# Patient Record
Sex: Male | Born: 1985 | Race: White | Hispanic: No | Marital: Married | Smoking: Never smoker
Health system: Southern US, Community
[De-identification: ages and names within clinical notes are randomized; demographics above are authoritative.]

## PROBLEM LIST (undated history)

## (undated) DIAGNOSIS — T7840XA Allergy, unspecified, initial encounter: Secondary | ICD-10-CM

## (undated) DIAGNOSIS — E079 Disorder of thyroid, unspecified: Secondary | ICD-10-CM

## (undated) HISTORY — DX: Allergy, unspecified, initial encounter: T78.40XA

## (undated) HISTORY — DX: Disorder of thyroid, unspecified: E07.9

---

## 2012-07-29 ENCOUNTER — Emergency Department: Payer: Self-pay | Admitting: Unknown Physician Specialty

## 2013-02-08 ENCOUNTER — Ambulatory Visit: Payer: Self-pay | Admitting: General Practice

## 2015-06-29 ENCOUNTER — Encounter: Payer: Self-pay | Admitting: Physician Assistant

## 2015-06-29 ENCOUNTER — Telehealth: Payer: Self-pay | Admitting: Physician Assistant

## 2015-06-29 DIAGNOSIS — J4520 Mild intermittent asthma, uncomplicated: Secondary | ICD-10-CM

## 2015-06-29 MED ORDER — BUDESONIDE-FORMOTEROL FUMARATE 80-4.5 MCG/ACT IN AERO: 2.0000 | INHALATION_SPRAY | Freq: Two times a day (BID) | RESPIRATORY_TRACT | Status: DC

## 2015-06-29 NOTE — Telephone Encounter (Signed)
Med refill request, sent rx to [facility], [place]

## 2015-08-01 ENCOUNTER — Ambulatory Visit: Payer: Self-pay | Admitting: Physician Assistant

## 2015-08-01 ENCOUNTER — Encounter: Payer: Self-pay | Admitting: Physician Assistant

## 2015-08-01 VITALS — BP 119/80 | HR 66 | Temp 97.9°F

## 2015-08-01 DIAGNOSIS — J069 Acute upper respiratory infection, unspecified: Secondary | ICD-10-CM

## 2015-08-01 MED ORDER — AMOXICILLIN-POT CLAVULANATE 875-125 MG PO TABS: 1.0000 | ORAL_TABLET | Freq: Two times a day (BID) | ORAL | Status: DC

## 2015-08-01 NOTE — Progress Notes (Signed)
S: C/o runny nose and congestion for 3 days, hoarse voice and sore throat, no fever, chills, cp/sob, v/d; mucus is green and thick, cough is sporadic, c/o of facial and dental pain.   Using otc meds:   O: PE: perrl eomi, normocephalic, tms dull, nasal mucosa red and swollen, throat injected, neck supple no lymph, lungs c t a, cv rrr, neuro intact  A:  Acute sinusitis   P: augmentin 875mg  bid x 10d, drink fluids, continue regular meds , use otc meds of choice, return if not improving in 5 days, return earlier if worsening , pt to return for fasting labs

## 2015-08-02 ENCOUNTER — Other Ambulatory Visit: Payer: Self-pay

## 2015-08-02 DIAGNOSIS — Z299 Encounter for prophylactic measures, unspecified: Secondary | ICD-10-CM

## 2015-08-02 NOTE — Progress Notes (Signed)
Patient came in to have blood drawn per Susan's order.  Blood was drawn from left arm without any incident.

## 2015-08-03 LAB — CMP12+LP+TP+TSH+6AC+CBC/D/PLT
ALT: 68 IU/L — AB (ref 0–44)
AST: 30 IU/L (ref 0–40)
Albumin/Globulin Ratio: 1.6 (ref 1.1–2.5)
Albumin: 4.3 g/dL (ref 3.5–5.5)
Alkaline Phosphatase: 77 IU/L (ref 39–117)
BASOS ABS: 0.1 10*3/uL (ref 0.0–0.2)
BUN / CREAT RATIO: 16 (ref 8–19)
BUN: 15 mg/dL (ref 6–20)
Basos: 1 %
Bilirubin Total: 0.4 mg/dL (ref 0.0–1.2)
CALCIUM: 9.4 mg/dL (ref 8.7–10.2)
CHLORIDE: 100 mmol/L (ref 97–106)
CHOL/HDL RATIO: 4.9 ratio (ref 0.0–5.0)
CREATININE: 0.95 mg/dL (ref 0.76–1.27)
Cholesterol, Total: 209 mg/dL — ABNORMAL HIGH (ref 100–199)
EOS (ABSOLUTE): 0.3 10*3/uL (ref 0.0–0.4)
EOS: 4 %
ESTIMATED CHD RISK: 1 times avg. (ref 0.0–1.0)
Free Thyroxine Index: 3.4 (ref 1.2–4.9)
GFR, EST AFRICAN AMERICAN: 123 mL/min/{1.73_m2} (ref >59)
GFR, EST NON AFRICAN AMERICAN: 106 mL/min/{1.73_m2} (ref >59)
GGT: 126 IU/L — AB (ref 0–65)
GLUCOSE: 87 mg/dL (ref 65–99)
Globulin, Total: 2.7 g/dL (ref 1.5–4.5)
HDL: 43 mg/dL (ref >39)
Hematocrit: 46.4 % (ref 37.5–51.0)
Hemoglobin: 15.5 g/dL (ref 12.6–17.7)
IRON: 114 ug/dL (ref 38–169)
Immature Grans (Abs): 0 10*3/uL (ref 0.0–0.1)
Immature Granulocytes: 0 %
LDH: 158 IU/L (ref 121–224)
LDL Calculated: 141 mg/dL — ABNORMAL HIGH (ref 0–99)
LYMPHS ABS: 2.9 10*3/uL (ref 0.7–3.1)
Lymphs: 41 %
MCH: 30.9 pg (ref 26.6–33.0)
MCHC: 33.4 g/dL (ref 31.5–35.7)
MCV: 92 fL (ref 79–97)
MONOCYTES: 8 %
Monocytes Absolute: 0.5 10*3/uL (ref 0.1–0.9)
NEUTROS ABS: 3.2 10*3/uL (ref 1.4–7.0)
Neutrophils: 46 %
PHOSPHORUS: 3.9 mg/dL (ref 2.5–4.5)
PLATELETS: 300 10*3/uL (ref 150–379)
POTASSIUM: 5 mmol/L (ref 3.5–5.2)
RBC: 5.02 x10E6/uL (ref 4.14–5.80)
RDW: 13 % (ref 12.3–15.4)
Sodium: 141 mmol/L (ref 136–144)
T3 UPTAKE RATIO: 30 % (ref 24–39)
T4 TOTAL: 11.4 ug/dL (ref 4.5–12.0)
TOTAL PROTEIN: 7 g/dL (ref 6.0–8.5)
TSH: 3.73 u[IU]/mL (ref 0.450–4.500)
Triglycerides: 127 mg/dL (ref 0–149)
URIC ACID: 6 mg/dL (ref 3.7–8.6)
VLDL Cholesterol Cal: 25 mg/dL (ref 5–40)
WBC: 7 10*3/uL (ref 3.4–10.8)

## 2015-08-03 LAB — VITAMIN B12: VITAMIN B 12: 678 pg/mL (ref 211–946)

## 2015-08-03 LAB — VITAMIN D 25 HYDROXY (VIT D DEFICIENCY, FRACTURES): VIT D 25 HYDROXY: 21 ng/mL — AB (ref 30.0–100.0)

## 2015-08-07 MED ORDER — VITAMIN D (ERGOCALCIFEROL) 1.25 MG (50000 UNIT) PO CAPS: 50000.0000 [IU] | ORAL_CAPSULE | ORAL | Status: DC

## 2015-08-07 NOTE — Addendum Note (Signed)
Addended by: Faythe GheeFISHER, Deshun Sedivy W on: 08/07/2015 03:54 PM   Modules accepted: Orders

## 2015-09-19 ENCOUNTER — Encounter: Payer: Self-pay | Admitting: Physician Assistant

## 2015-09-19 ENCOUNTER — Ambulatory Visit: Payer: Self-pay | Admitting: Physician Assistant

## 2015-09-19 VITALS — BP 120/80 | HR 62 | Temp 97.6°F

## 2015-09-19 DIAGNOSIS — J018 Other acute sinusitis: Secondary | ICD-10-CM

## 2015-09-19 DIAGNOSIS — R748 Abnormal levels of other serum enzymes: Secondary | ICD-10-CM

## 2015-09-19 MED ORDER — AMOXICILLIN-POT CLAVULANATE 875-125 MG PO TABS: 1.0000 | ORAL_TABLET | Freq: Two times a day (BID) | ORAL | Status: DC

## 2015-09-19 NOTE — Progress Notes (Signed)
Contacted [facility] spoke with Alvino ChapelEllen cancel order. Contacted [facility]([place]) per Darl PikesSusan for Augmentin 875mg   1 po bid no refill.  [place]

## 2015-09-19 NOTE — Addendum Note (Signed)
Addended by: Catha BrowEACON, MONIQUE T on: 09/19/2015 03:07 PM   Modules accepted: Orders

## 2015-09-19 NOTE — Progress Notes (Signed)
S: C/o runny nose and congestion for 3 days, no fever, chills, cp/sob, v/d; mucus is green and thick, cough is sporadic, c/o of facial and dental pain. 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

## 2015-09-20 LAB — HEPATIC FUNCTION PANEL
ALT: 63 IU/L — ABNORMAL HIGH (ref 0–44)
AST: 34 IU/L (ref 0–40)
Albumin: 4.4 g/dL (ref 3.5–5.5)
Alkaline Phosphatase: 48 IU/L (ref 39–117)
BILIRUBIN, DIRECT: 0.16 mg/dL (ref 0.00–0.40)
Bilirubin Total: 0.6 mg/dL (ref 0.0–1.2)
TOTAL PROTEIN: 7.2 g/dL (ref 6.0–8.5)

## 2015-09-21 NOTE — Progress Notes (Signed)
I spoke with the patient about his lab results and he expressed understanding.  Patient will return in 6 months to recheck his liver enzyme levels.

## 2015-10-16 ENCOUNTER — Encounter: Payer: Self-pay | Admitting: Physician Assistant

## 2015-10-16 ENCOUNTER — Ambulatory Visit: Payer: Self-pay | Admitting: Physician Assistant

## 2015-10-16 VITALS — BP 120/80 | HR 95 | Temp 98.0°F

## 2015-10-16 DIAGNOSIS — J018 Other acute sinusitis: Secondary | ICD-10-CM

## 2015-10-16 MED ORDER — CEFDINIR 300 MG PO CAPS: 300.0000 mg | ORAL_CAPSULE | Freq: Two times a day (BID) | ORAL | Status: DC

## 2015-10-16 NOTE — Progress Notes (Signed)
S: C/o runny nose and congestion for 3 days, brown mucus, recent similar problem got better with augmentin but sx returned;  no fever, chills, cp/sob, v/d; c/o of facial and dental pain, increased pressure behind r eye  Using otc meds:   O: PE: vitals wnl, nad, perrl eomi, normocephalic, tms dull, nasal mucosa red and swollen, throat injected, neck supple no lymph, lungs c t a, cv rrr, neuro intact  A:  Acute sinusitis   P: omnicef, saline nasal rinse bid; claritin; drink fluids, continue regular meds , use otc meds of choice, return if not improving in 5 days, return earlier if worsening

## 2015-10-31 NOTE — Progress Notes (Signed)
Patient ID: Howard Decker, male   DOB: 1986/05/21, 30 y.o.   MRN: [id number] Patient has been scheduled to see Dr. Jenne Campus at [facility] on 11/01/2015 @ 9:45am.  Patient is aware of appointment.

## 2015-11-01 ENCOUNTER — Encounter: Payer: Self-pay | Admitting: Physician Assistant

## 2015-11-01 ENCOUNTER — Ambulatory Visit: Payer: Self-pay | Admitting: Physician Assistant

## 2015-11-01 VITALS — BP 110/70 | HR 66 | Temp 97.6°F

## 2015-11-01 DIAGNOSIS — K122 Cellulitis and abscess of mouth: Secondary | ICD-10-CM

## 2015-11-01 MED ORDER — METHYLPREDNISOLONE 4 MG PO TBPK: ORAL_TABLET | ORAL | Status: DC

## 2015-11-01 NOTE — Progress Notes (Signed)
S: c/o sore on uvula, no fever/chills, saw some blood in his saliva, no cough or congestion  O: vitals wnl, nad, tms clear, nasal mucosa wnl, throat with elongated uvula, area is red with ?ulcer, neck supple no lymph lungs c t a, cv rrr  A: uvulitis  P: medrol dose pack, f/u with ENT for scheduled appt on Friday

## 2015-11-13 DIAGNOSIS — J324 Chronic pansinusitis: Secondary | ICD-10-CM | POA: Insufficient documentation

## 2015-12-07 ENCOUNTER — Ambulatory Visit: Payer: Self-pay | Admitting: Physician Assistant

## 2015-12-07 ENCOUNTER — Encounter: Payer: Self-pay | Admitting: Physician Assistant

## 2015-12-07 VITALS — BP 130/70 | HR 78 | Temp 98.2°F

## 2015-12-07 DIAGNOSIS — J069 Acute upper respiratory infection, unspecified: Secondary | ICD-10-CM

## 2015-12-07 DIAGNOSIS — R509 Fever, unspecified: Secondary | ICD-10-CM

## 2015-12-07 LAB — POCT INFLUENZA A/B
INFLUENZA B, POC: NEGATIVE
Influenza A, POC: NEGATIVE

## 2015-12-07 MED ORDER — AZITHROMYCIN 250 MG PO TABS: ORAL_TABLET | ORAL | Status: AC

## 2015-12-07 NOTE — Progress Notes (Signed)
S: C/o runny nose and congestion with dry cough for 2 days, + fever, chills, has more chest congestion than head congestion, denies cp/sob, v/d; mucus was green/brown this am ,   Using otc meds: allegra, mucinex  O: PE: vitals wnl, nad,  perrl eomi, normocephalic, tms dull, nasal mucosa red and swollen, throat injected, neck supple no lymph, lungs c t a, cv rrr, neuro intact, flu swab neg  A:  Acute bronchitis  P: zpack, mucinex, drink fluids, continue regular meds , use otc meds of choice, return if not improving in 5 days, return earlier if worsening

## 2015-12-08 ENCOUNTER — Other Ambulatory Visit: Payer: Self-pay

## 2015-12-08 DIAGNOSIS — Z299 Encounter for prophylactic measures, unspecified: Secondary | ICD-10-CM

## 2015-12-08 NOTE — Progress Notes (Signed)
Patient came in to have blood drawn per Susan's orders.  Blood was drawn from the right arm without any incident. 

## 2015-12-09 LAB — CMP12+LP+TP+TSH+6AC+PSA+CBC…
ALBUMIN: 3.9 g/dL (ref 3.5–5.5)
ALT: 95 IU/L — AB (ref 0–44)
AST: 58 IU/L — AB (ref 0–40)
Albumin/Globulin Ratio: 1.6 (ref 1.2–2.2)
Alkaline Phosphatase: 71 IU/L (ref 39–117)
BASOS ABS: 0 10*3/uL (ref 0.0–0.2)
BUN/Creatinine Ratio: 17 (ref 8–19)
BUN: 15 mg/dL (ref 6–20)
Basos: 0 %
Bilirubin Total: 0.4 mg/dL (ref 0.0–1.2)
CHLORIDE: 103 mmol/L (ref 96–106)
CREATININE: 0.9 mg/dL (ref 0.76–1.27)
Calcium: 8.7 mg/dL (ref 8.7–10.2)
Chol/HDL Ratio: 3.7 ratio units (ref 0.0–5.0)
Cholesterol, Total: 181 mg/dL (ref 100–199)
EOS (ABSOLUTE): 0.1 10*3/uL (ref 0.0–0.4)
ESTIMATED CHD RISK: 0.6 times avg. (ref 0.0–1.0)
Eos: 2 %
Free Thyroxine Index: 2.5 (ref 1.2–4.9)
GFR, EST AFRICAN AMERICAN: 131 mL/min/{1.73_m2} (ref >59)
GFR, EST NON AFRICAN AMERICAN: 113 mL/min/{1.73_m2} (ref >59)
GGT: 152 IU/L — AB (ref 0–65)
GLUCOSE: 114 mg/dL — AB (ref 65–99)
Globulin, Total: 2.4 g/dL (ref 1.5–4.5)
HDL: 49 mg/dL (ref >39)
Hematocrit: 45.3 % (ref 37.5–51.0)
Hemoglobin: 15.1 g/dL (ref 12.6–17.7)
IRON: 41 ug/dL (ref 38–169)
Immature Grans (Abs): 0 10*3/uL (ref 0.0–0.1)
Immature Granulocytes: 0 %
LDH: 159 IU/L (ref 121–224)
LDL Calculated: 118 mg/dL — ABNORMAL HIGH (ref 0–99)
LYMPHS ABS: 1.1 10*3/uL (ref 0.7–3.1)
Lymphs: 18 %
MCH: 30.7 pg (ref 26.6–33.0)
MCHC: 33.3 g/dL (ref 31.5–35.7)
MCV: 92 fL (ref 79–97)
MONOCYTES: 9 %
MONOS ABS: 0.5 10*3/uL (ref 0.1–0.9)
NEUTROS ABS: 4.1 10*3/uL (ref 1.4–7.0)
Neutrophils: 71 %
PHOSPHORUS: 1.7 mg/dL — AB (ref 2.5–4.5)
PLATELETS: 230 10*3/uL (ref 150–379)
POTASSIUM: 4.2 mmol/L (ref 3.5–5.2)
Prostate Specific Ag, Serum: 0.7 ng/mL (ref 0.0–4.0)
RBC: 4.92 x10E6/uL (ref 4.14–5.80)
RDW: 13.1 % (ref 12.3–15.4)
Sodium: 138 mmol/L (ref 134–144)
T3 UPTAKE RATIO: 32 % (ref 24–39)
T4 TOTAL: 7.9 ug/dL (ref 4.5–12.0)
TOTAL PROTEIN: 6.3 g/dL (ref 6.0–8.5)
TSH: 1.22 u[IU]/mL (ref 0.450–4.500)
Triglycerides: 69 mg/dL (ref 0–149)
URIC ACID: 4.6 mg/dL (ref 3.7–8.6)
VLDL CHOLESTEROL CAL: 14 mg/dL (ref 5–40)
WBC: 5.8 10*3/uL (ref 3.4–10.8)

## 2015-12-09 LAB — VITAMIN D 25 HYDROXY (VIT D DEFICIENCY, FRACTURES): VIT D 25 HYDROXY: 35.7 ng/mL (ref 30.0–100.0)

## 2015-12-12 LAB — HGB A1C W/O EAG: Hgb A1c MFr Bld: 5.5 % (ref 4.8–5.6)

## 2015-12-12 LAB — SPECIMEN STATUS REPORT

## 2015-12-13 NOTE — Progress Notes (Signed)
Patient called back and I informed him of his lab results.  He expressed understanding and will come back in one month to recheck his liver enzymes.

## 2015-12-13 NOTE — Progress Notes (Signed)
I left a message for the patient to call me back

## 2016-01-03 ENCOUNTER — Other Ambulatory Visit: Payer: Self-pay | Admitting: Emergency Medicine

## 2016-01-03 NOTE — Telephone Encounter (Signed)
Received a faxed medication request from [facility] in [place].  Please advise.  Thank you. 

## 2016-01-04 MED ORDER — LEVOTHYROXINE SODIUM 150 MCG PO TABS: ORAL_TABLET | ORAL | Status: DC

## 2016-01-04 NOTE — Telephone Encounter (Signed)
Med refill approved, labs from 12/08/15 show normal tsh level

## 2016-05-24 ENCOUNTER — Other Ambulatory Visit: Payer: Self-pay

## 2016-05-29 ENCOUNTER — Other Ambulatory Visit: Payer: Self-pay

## 2016-05-29 ENCOUNTER — Encounter (INDEPENDENT_AMBULATORY_CARE_PROVIDER_SITE_OTHER): Payer: Self-pay

## 2016-05-29 DIAGNOSIS — Z299 Encounter for prophylactic measures, unspecified: Secondary | ICD-10-CM

## 2016-05-29 NOTE — Progress Notes (Signed)
Patient came in to get blood drawn for testing per Susan's authorization.  Patient wants to be called when his results are finalized.

## 2016-05-30 LAB — CMP12+LP+TP+TSH+6AC+CBC/D/PLT
ALBUMIN: 4.2 g/dL (ref 3.5–5.5)
ALK PHOS: 47 IU/L (ref 39–117)
ALT: 34 IU/L (ref 0–44)
AST: 26 IU/L (ref 0–40)
Albumin/Globulin Ratio: 1.7 (ref 1.2–2.2)
BASOS: 0 %
BILIRUBIN TOTAL: 0.9 mg/dL (ref 0.0–1.2)
BUN / CREAT RATIO: 12 (ref 9–20)
BUN: 13 mg/dL (ref 6–20)
Basophils Absolute: 0 10*3/uL (ref 0.0–0.2)
CALCIUM: 9.2 mg/dL (ref 8.7–10.2)
CHLORIDE: 104 mmol/L (ref 96–106)
CHOL/HDL RATIO: 5.6 ratio — AB (ref 0.0–5.0)
CHOLESTEROL TOTAL: 229 mg/dL — AB (ref 100–199)
Creatinine, Ser: 1.06 mg/dL (ref 0.76–1.27)
EOS (ABSOLUTE): 0.2 10*3/uL (ref 0.0–0.4)
EOS: 4 %
ESTIMATED CHD RISK: 1.2 times avg. — AB (ref 0.0–1.0)
FREE THYROXINE INDEX: 2 (ref 1.2–4.9)
GFR calc non Af Amer: 92 mL/min/{1.73_m2} (ref >59)
GFR, EST AFRICAN AMERICAN: 107 mL/min/{1.73_m2} (ref >59)
GGT: 61 IU/L (ref 0–65)
Globulin, Total: 2.5 g/dL (ref 1.5–4.5)
Glucose: 92 mg/dL (ref 65–99)
HDL: 41 mg/dL (ref >39)
HEMATOCRIT: 46.3 % (ref 37.5–51.0)
HEMOGLOBIN: 15.2 g/dL (ref 12.6–17.7)
IMMATURE GRANS (ABS): 0 10*3/uL (ref 0.0–0.1)
IMMATURE GRANULOCYTES: 0 %
Iron: 174 ug/dL — ABNORMAL HIGH (ref 38–169)
LDH: 140 IU/L (ref 121–224)
LDL CALC: 158 mg/dL — AB (ref 0–99)
LYMPHS: 46 %
Lymphocytes Absolute: 2.5 10*3/uL (ref 0.7–3.1)
MCH: 30.2 pg (ref 26.6–33.0)
MCHC: 32.8 g/dL (ref 31.5–35.7)
MCV: 92 fL (ref 79–97)
MONOCYTES: 10 %
Monocytes Absolute: 0.6 10*3/uL (ref 0.1–0.9)
NEUTROS ABS: 2.2 10*3/uL (ref 1.4–7.0)
NEUTROS PCT: 40 %
POTASSIUM: 4.4 mmol/L (ref 3.5–5.2)
Phosphorus: 3.5 mg/dL (ref 2.5–4.5)
Platelets: 285 10*3/uL (ref 150–379)
RBC: 5.03 x10E6/uL (ref 4.14–5.80)
RDW: 13.3 % (ref 12.3–15.4)
SODIUM: 142 mmol/L (ref 134–144)
T3 Uptake Ratio: 27 % (ref 24–39)
T4, Total: 7.3 ug/dL (ref 4.5–12.0)
TRIGLYCERIDES: 151 mg/dL — AB (ref 0–149)
TSH: 2.65 u[IU]/mL (ref 0.450–4.500)
Total Protein: 6.7 g/dL (ref 6.0–8.5)
Uric Acid: 6.9 mg/dL (ref 3.7–8.6)
VLDL CHOLESTEROL CAL: 30 mg/dL (ref 5–40)
WBC: 5.5 10*3/uL (ref 3.4–10.8)

## 2016-06-15 ENCOUNTER — Other Ambulatory Visit: Payer: Self-pay | Admitting: Physician Assistant

## 2016-06-17 NOTE — Telephone Encounter (Signed)
Med refill approved 

## 2016-06-22 ENCOUNTER — Other Ambulatory Visit: Payer: Self-pay | Admitting: Physician Assistant

## 2016-06-22 DIAGNOSIS — J4520 Mild intermittent asthma, uncomplicated: Secondary | ICD-10-CM

## 2016-06-24 NOTE — Telephone Encounter (Signed)
Refill approved, pt has hx of asthma

## 2016-06-28 ENCOUNTER — Other Ambulatory Visit: Payer: Self-pay | Admitting: Emergency Medicine

## 2016-07-01 MED ORDER — FLUTICASONE PROPIONATE 50 MCG/ACT NA SUSP: NASAL | 12 refills | Status: DC

## 2016-07-01 NOTE — Telephone Encounter (Signed)
Med refill approved 

## 2016-07-08 ENCOUNTER — Encounter: Payer: Self-pay | Admitting: Physician Assistant

## 2016-07-08 ENCOUNTER — Ambulatory Visit: Payer: Self-pay | Admitting: Physician Assistant

## 2016-07-08 VITALS — BP 129/71 | HR 67 | Temp 97.8°F

## 2016-07-08 DIAGNOSIS — J0100 Acute maxillary sinusitis, unspecified: Secondary | ICD-10-CM

## 2016-07-08 MED ORDER — FEXOFENADINE HCL 180 MG PO TABS: 180.0000 mg | ORAL_TABLET | Freq: Every day | ORAL | 12 refills | Status: DC

## 2016-07-08 MED ORDER — AMOXICILLIN-POT CLAVULANATE 875-125 MG PO TABS: 1.0000 | ORAL_TABLET | Freq: Two times a day (BID) | ORAL | 0 refills | Status: DC

## 2016-07-08 NOTE — Progress Notes (Signed)
S: C/o runny nose and congestion for 3 -4 days, no fever, chills, cp/sob, v/d; mucus is yellow/clear and thick, cough is sporadic, c/o of facial and dental pain. Using saline nasal rinse which has helped a lot, has to travel to [place] this weekend  Using otc meds:   O: PE: vital wnl, nad, perrl eomi, normocephalic, tms dull, nasal mucosa red and swollen, throat injected, neck supple no lymph, lungs c t a, cv rrr, neuro intact  A:  Acute sinusitis   P: drink fluids, continue regular meds , use otc meds of choice, return if not improving in 5 days, return earlier if worsening , augmentin, allegra, if not improving with allegra or if its too expensive will do trial of singulair

## 2016-07-19 ENCOUNTER — Other Ambulatory Visit: Payer: Self-pay | Admitting: Physician Assistant

## 2016-08-20 ENCOUNTER — Other Ambulatory Visit: Payer: Self-pay | Admitting: Emergency Medicine

## 2016-08-20 ENCOUNTER — Ambulatory Visit: Payer: Self-pay | Admitting: Physician Assistant

## 2016-08-20 MED ORDER — LEVOTHYROXINE SODIUM 150 MCG PO TABS: 150.0000 ug | ORAL_TABLET | Freq: Every day | ORAL | 4 refills | Status: DC

## 2016-08-20 NOTE — Telephone Encounter (Signed)
Med refill approved, will need tsh level when refills run out

## 2016-08-21 ENCOUNTER — Encounter: Payer: Self-pay | Admitting: Physician Assistant

## 2016-08-21 ENCOUNTER — Ambulatory Visit: Payer: Self-pay | Admitting: Physician Assistant

## 2016-08-21 VITALS — BP 125/71 | HR 55 | Temp 97.1°F

## 2016-08-21 DIAGNOSIS — E039 Hypothyroidism, unspecified: Secondary | ICD-10-CM | POA: Insufficient documentation

## 2016-08-21 DIAGNOSIS — K122 Cellulitis and abscess of mouth: Secondary | ICD-10-CM

## 2016-08-21 DIAGNOSIS — J45909 Unspecified asthma, uncomplicated: Secondary | ICD-10-CM | POA: Insufficient documentation

## 2016-08-21 MED ORDER — METHYLPREDNISOLONE 4 MG PO TBPK: ORAL_TABLET | ORAL | 0 refills | Status: DC

## 2016-08-21 NOTE — Progress Notes (Addendum)
S: C/o sore throat, runny nose and congestion for 1 week, no fever, chills, cp/sob, v/d; mucus is mostly clear, occasional yellow tinge, wife and child have both been sick Using otc meds:   O: PE: vitals wnl, nad, perrl eomi, normocephalic, tms dull, nasal mucosa wnl, throat injected uvula swollen, neck supple no lymph, lungs c t a, cv rrr, neuro intact  A:  Acute uvulitis   P: drink fluids, continue regular meds , use otc meds of choice, return if not improving in 5 days, return earlier if worsening , medrol dose pack  pt called office stating he is worse and is getting colored mucus out, called in rx omnicef to pharmacy

## 2016-08-26 MED ORDER — CEFDINIR 300 MG PO CAPS: 300.0000 mg | ORAL_CAPSULE | Freq: Two times a day (BID) | ORAL | 0 refills | Status: DC

## 2016-08-26 NOTE — Addendum Note (Signed)
Addended by: Faythe GheeFISHER, SUSAN W on: 08/26/2016 12:17 PM   Modules accepted: Orders

## 2016-08-28 NOTE — Progress Notes (Signed)
Per Susan's authorization I called in Biaxin XL 500mg  [facility] to [facility] [place].

## 2016-09-12 ENCOUNTER — Encounter: Payer: Self-pay | Admitting: Physician Assistant

## 2016-09-12 ENCOUNTER — Ambulatory Visit: Payer: Self-pay | Admitting: Physician Assistant

## 2016-09-12 VITALS — BP 101/70 | HR 68 | Temp 97.5°F

## 2016-09-12 DIAGNOSIS — J320 Chronic maxillary sinusitis: Secondary | ICD-10-CM

## 2016-09-12 NOTE — Progress Notes (Signed)
S: C/o runny nose and congestion for several weeks, no fever, chills, cp/sob, v/d; mucus is green and thick early in the day but then its clear, cough is sporadic, c/o of facial and dental pain. 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

## 2016-12-11 ENCOUNTER — Encounter: Payer: Self-pay | Admitting: Physician Assistant

## 2016-12-11 ENCOUNTER — Ambulatory Visit: Payer: Self-pay | Admitting: Physician Assistant

## 2016-12-11 VITALS — BP 110/65 | HR 51 | Temp 97.8°F

## 2016-12-11 DIAGNOSIS — J029 Acute pharyngitis, unspecified: Secondary | ICD-10-CM

## 2016-12-11 MED ORDER — AMOXICILLIN-POT CLAVULANATE 875-125 MG PO TABS: 1.0000 | ORAL_TABLET | Freq: Two times a day (BID) | ORAL | 0 refills | Status: DC

## 2016-12-11 NOTE — Progress Notes (Signed)
S: C/o runny nose and congestion with sore throat for 3 days, no fever, chills, cp/sob, v/d; mucus was yellow this am but clear throughout the day, cough is sporadic,   Using otc meds:   O: PE: vitals wnl, nad, perrl eomi, normocephalic, tms dull, nasal mucosa red and swollen, throat injected, neck supple no lymph, lungs c t a, cv rrr, neuro intact  A:  Acute pharyngitis   P: drink fluids, continue regular meds , use otc meds of choice, return if not improving in 5 days, return earlier if worsening , augmentin

## 2017-01-08 ENCOUNTER — Encounter (INDEPENDENT_AMBULATORY_CARE_PROVIDER_SITE_OTHER): Payer: Self-pay

## 2017-01-08 ENCOUNTER — Ambulatory Visit: Payer: Self-pay | Admitting: Physician Assistant

## 2017-01-08 ENCOUNTER — Encounter: Payer: Self-pay | Admitting: Physician Assistant

## 2017-01-08 VITALS — BP 100/70 | HR 57 | Ht 68.0 in | Wt 199.0 lb

## 2017-01-08 DIAGNOSIS — Z008 Encounter for other general examination: Secondary | ICD-10-CM

## 2017-01-08 DIAGNOSIS — Z0189 Encounter for other specified special examinations: Secondary | ICD-10-CM

## 2017-01-08 DIAGNOSIS — Z0000 Encounter for general adult medical examination without abnormal findings: Secondary | ICD-10-CM

## 2017-01-08 NOTE — Progress Notes (Signed)
S: pt here for wellness physical and biometrics for insurance purposes, no complaints ros neg. PMH:   Hypothyroidism, allergies, asthma  Social: nonsmoker, +dip, no drugs Fam: dm, cad, hypothyroidism, etoh abuse  O: vitals wnl, nad, ENT wnl, neck supple no lymph, lungs c t a, cv rrr, abd soft nontender bs normal all 4 quads  A: wellness, biometric physical  P: labs , f/u prn

## 2017-01-09 LAB — CMP12+LP+TP+TSH+6AC+CBC/D/PLT
ALT: 45 IU/L — AB (ref 0–44)
AST: 26 IU/L (ref 0–40)
Albumin/Globulin Ratio: 1.6 (ref 1.2–2.2)
Albumin: 4.1 g/dL (ref 3.5–5.5)
Alkaline Phosphatase: 47 IU/L (ref 39–117)
BASOS: 0 %
BUN / CREAT RATIO: 14 (ref 9–20)
BUN: 13 mg/dL (ref 6–20)
Basophils Absolute: 0 10*3/uL (ref 0.0–0.2)
Bilirubin Total: 0.5 mg/dL (ref 0.0–1.2)
CALCIUM: 9.4 mg/dL (ref 8.7–10.2)
CHLORIDE: 104 mmol/L (ref 96–106)
CHOL/HDL RATIO: 4.6 ratio (ref 0.0–5.0)
CREATININE: 0.9 mg/dL (ref 0.76–1.27)
Cholesterol, Total: 195 mg/dL (ref 100–199)
EOS (ABSOLUTE): 0.3 10*3/uL (ref 0.0–0.4)
Eos: 5 %
Estimated CHD Risk: 0.9 times avg. (ref 0.0–1.0)
Free Thyroxine Index: 2.1 (ref 1.2–4.9)
GFR, EST AFRICAN AMERICAN: 129 mL/min/{1.73_m2} (ref >59)
GFR, EST NON AFRICAN AMERICAN: 112 mL/min/{1.73_m2} (ref >59)
GGT: 64 IU/L (ref 0–65)
GLUCOSE: 96 mg/dL (ref 65–99)
Globulin, Total: 2.5 g/dL (ref 1.5–4.5)
HDL: 42 mg/dL (ref >39)
Hematocrit: 47.7 % (ref 37.5–51.0)
Hemoglobin: 16 g/dL (ref 13.0–17.7)
Immature Grans (Abs): 0 10*3/uL (ref 0.0–0.1)
Immature Granulocytes: 0 %
Iron: 142 ug/dL (ref 38–169)
LDH: 133 IU/L (ref 121–224)
LDL Calculated: 124 mg/dL — ABNORMAL HIGH (ref 0–99)
Lymphocytes Absolute: 2.5 10*3/uL (ref 0.7–3.1)
Lymphs: 43 %
MCH: 30.5 pg (ref 26.6–33.0)
MCHC: 33.5 g/dL (ref 31.5–35.7)
MCV: 91 fL (ref 79–97)
MONOCYTES: 8 %
Monocytes Absolute: 0.5 10*3/uL (ref 0.1–0.9)
NEUTROS ABS: 2.6 10*3/uL (ref 1.4–7.0)
Neutrophils: 44 %
PHOSPHORUS: 3.3 mg/dL (ref 2.5–4.5)
Platelets: 281 10*3/uL (ref 150–379)
Potassium: 4.9 mmol/L (ref 3.5–5.2)
RBC: 5.24 x10E6/uL (ref 4.14–5.80)
RDW: 12.9 % (ref 12.3–15.4)
SODIUM: 144 mmol/L (ref 134–144)
T3 Uptake Ratio: 30 % (ref 24–39)
T4 TOTAL: 6.9 ug/dL (ref 4.5–12.0)
TSH: 2.55 u[IU]/mL (ref 0.450–4.500)
Total Protein: 6.6 g/dL (ref 6.0–8.5)
Triglycerides: 145 mg/dL (ref 0–149)
URIC ACID: 5.5 mg/dL (ref 3.7–8.6)
VLDL Cholesterol Cal: 29 mg/dL (ref 5–40)
WBC: 5.8 10*3/uL (ref 3.4–10.8)

## 2017-01-09 LAB — VITAMIN D 25 HYDROXY (VIT D DEFICIENCY, FRACTURES): Vit D, 25-Hydroxy: 47.5 ng/mL (ref 30.0–100.0)

## 2017-01-24 ENCOUNTER — Other Ambulatory Visit: Payer: Self-pay | Admitting: Physician Assistant

## 2017-01-24 NOTE — Telephone Encounter (Signed)
Med refill approved for synthroid, pt recently seen for wellness physical

## 2017-07-03 ENCOUNTER — Other Ambulatory Visit: Payer: Self-pay | Admitting: Physician Assistant

## 2017-07-03 DIAGNOSIS — J4520 Mild intermittent asthma, uncomplicated: Secondary | ICD-10-CM

## 2017-07-04 NOTE — Telephone Encounter (Signed)
Med refill for symbicort, vit d, and flonase approved

## 2017-07-28 ENCOUNTER — Encounter: Payer: Self-pay | Admitting: Physician Assistant

## 2017-07-28 ENCOUNTER — Ambulatory Visit: Payer: Self-pay | Admitting: Physician Assistant

## 2017-07-28 VITALS — BP 123/80 | HR 54 | Temp 98.4°F | Resp 16

## 2017-07-28 DIAGNOSIS — J0100 Acute maxillary sinusitis, unspecified: Secondary | ICD-10-CM

## 2017-07-28 MED ORDER — PREDNISONE 10 MG PO TABS: 30.0000 mg | ORAL_TABLET | Freq: Every day | ORAL | 0 refills | Status: DC

## 2017-07-28 MED ORDER — AMOXICILLIN-POT CLAVULANATE 875-125 MG PO TABS: 1.0000 | ORAL_TABLET | Freq: Two times a day (BID) | ORAL | 0 refills | Status: DC

## 2017-07-28 NOTE — Progress Notes (Signed)
S: A she complains of sore throat, sinus pain, sinus pressure, congestion, white with green mucus, some fever and chills other day, none today, denies chest pain, shortness of breath, vomiting, diarrhea; states he has not had any difficulty breathing, history of asthma and chronic sinusitis  O: Vitals are normal, TMs are dull, nasal mucosa is grossly red and swollen with very little airway, throat is normal, neck is supple, no lymphadenopathy noted, lungs are clear to auscultation, heart sounds are normal   A: acute , chronic sinusitis  P: Augmentin 875 mg twice a day for 10 days, prednisone 30 mg daily for 3 days, patient is to continue saline nasal rinse, return to the clinic if he is not better in 3-5 days

## 2017-08-11 ENCOUNTER — Ambulatory Visit: Payer: Self-pay | Admitting: Physician Assistant

## 2017-08-11 ENCOUNTER — Encounter: Payer: Self-pay | Admitting: Physician Assistant

## 2017-08-11 VITALS — BP 137/66 | HR 60 | Temp 98.3°F | Resp 16

## 2017-08-11 DIAGNOSIS — J0120 Acute ethmoidal sinusitis, unspecified: Secondary | ICD-10-CM

## 2017-08-11 MED ORDER — AMOXICILLIN-POT CLAVULANATE 875-125 MG PO TABS: 1.0000 | ORAL_TABLET | Freq: Two times a day (BID) | ORAL | 0 refills | Status: DC

## 2017-08-11 MED ORDER — FEXOFENADINE-PSEUDOEPHED ER 60-120 MG PO TB12: 1.0000 | ORAL_TABLET | Freq: Two times a day (BID) | ORAL | 0 refills | Status: DC

## 2017-08-11 NOTE — Progress Notes (Signed)
   Subjective: Sinus congestion     Patient ID: Howard Decker, male    DOB: April 17, 1986, 31 y.o.   MRN: [id number]  HPI Patient follow-up for sinusitis. Patient was seen about 12 days ago and put on Augmentin. Patient state he finished prescription for days ago but the next day his sinus problems return. Patient was seen by the company nurse and was given 3 tablets of Augmentin today for follow-up today. Advised the patient no male would not prescribe another round antibiotics so close together but since he is started the medication will refill the prescription. Patient denies fevers chills associated with this complaint. Patient's currently taking Allegra.   Review of Systems Hypothyroidism    Objective:   Physical Exam HEENT remarkable for bilateral maxillary guarding. Edematous nasal turbinates. Postnasal drainage. Neck is supple for adenopathy. Lungs CTA heart regular rate and rhythm.       Assessment & Plan: Sinus congestion   Again discussed with patient rationale for not continuing antibiotics after this around without talking to his specialist at ENT. Patient also will be given a prescription for Allegra-D.

## 2017-09-15 ENCOUNTER — Other Ambulatory Visit: Payer: Self-pay | Admitting: Emergency Medicine

## 2017-09-15 MED ORDER — LEVOTHYROXINE SODIUM 150 MCG PO TABS: ORAL_TABLET | ORAL | 6 refills | Status: AC

## 2017-11-03 ENCOUNTER — Encounter: Payer: Self-pay | Admitting: Family Medicine

## 2017-11-03 ENCOUNTER — Ambulatory Visit: Payer: Self-pay | Admitting: Family Medicine

## 2017-11-03 VITALS — BP 128/78 | HR 71 | Temp 98.6°F | Resp 16

## 2017-11-03 DIAGNOSIS — J019 Acute sinusitis, unspecified: Secondary | ICD-10-CM

## 2017-11-03 MED ORDER — AMOXICILLIN-POT CLAVULANATE ER 1000-62.5 MG PO TB12: 2.0000 | ORAL_TABLET | Freq: Two times a day (BID) | ORAL | 0 refills | Status: DC

## 2017-11-03 NOTE — Progress Notes (Addendum)
Subjective: congestion     Howard Decker is a 32 y.o. male who presents for evaluation of symptoms of possible sinusitis. Symptoms include a mild headache, nasal congestion, purulent nasal discharge, sinus pressure and sore/scracthy throat. Onset of symptoms was 5 days ago, and has been unchanged since that time.  Describes the symptoms as mild. Treatment to date: nasal steroids and nasal saline irrigation.  Patient has a history of frequent sinus infections and has seen ENT in the past for this.  Patient was instructed by ENT to use steroid nasal spray and saline nasal irrigation regularly for symptoms and to prevent recurrent sinus infections.  Patient reports infrequent use of these modalities.  Patient has a history of asthma, which he uses Symbicort only on occasion when he is symptomatic, with reportedly good relief.  Reports using this once a day since the onset of his symptoms.  Denies having or using a rescue inhaler.  Denies shortness of breath, wheezing, cough, chest tightness. Denies rash, nausea, vomiting, diarrhea,  ear pain, difficulty swallowing, confusion,  body aches, fatigue, fever, chills, sneezing, ocular pruritis/discharge, or initial improvement and then worsening of symptoms.  Medical history: asthma, recurrent sinus infections, hypothyroidism.  Reports antibiotic use within the past 3 months in early to mid December for sinusitis.  Review of Systems Pertinent items noted in HPI and remainder of comprehensive ROS otherwise negative.     Objective:   Physical Exam General: Awake, alert, and oriented. No acute distress. Well developed, hydrated and nourished. Appears stated age.  HEENT: PND noted. No edema or exudates of pharynx or tonsils.  Slight erythema to posterior oropharynx.  No erythema or bulging of TM.  Slight erythema/edema to nasal mucosa. Sinuses nontender. Supple neck without adenopathy. Cardiac: Heart rate and rhythm are normal. No murmurs, gallops, or rubs are  auscultated. S1 and S2 are heard and are of normal intensity.  Respiratory: No signs of respiratory distress. Lungs clear. No tachypnea. Able to speak in full sentences without dyspnea. Skin: Skin is warm, dry and intact. Appropriate color for ethnicity. No cyanosis noted.    Assessment:    sinusitis and viral upper respiratory infection  Plan:    Discussed the diagnosis and treatment of sinusitis. Discussed the importance of avoiding unnecessary antibiotic therapy.  Prescribed high-dose Augmentin (due to antibiotic use in the previous 3 months) and instructed patient that this is only to be filled if he has no improvement in symptoms by day 10 of his illness or his symptoms worsen. Suggested symptomatic OTC remedies. Nasal saline spray for congestion.  Informed patient that Flonase produces better results with daily use, as opposed to as needed.  Recommended nasal saline spray use prior to intranasal glucocorticoid use.  Offered to prescribe an albuterol inhaler to use when needed, patient declined. Follow up with ENT as needed.  Encourage patient to establish care with a primary care provider. 11/12/17: received a call from patient stating that he was unable to pick up script due to cost. Patient is now outside of 3 month window of recent antibiotic use, therefore he can use regular dose of Augmentin.  Patient reports he is filling the antibiotic prescription because his symptoms have persisted for over 2 weeks now without any improvement.  Denies any new symptoms or concerns.  Augmentin sent to patient's pharmacy.  Red flag symptoms and indications to return to care discussed. New Prescriptions   No medications on file

## 2017-11-03 NOTE — Progress Notes (Signed)
128/78

## 2017-11-12 MED ORDER — AMOXICILLIN-POT CLAVULANATE 875-125 MG PO TABS: 1.0000 | ORAL_TABLET | Freq: Two times a day (BID) | ORAL | 0 refills | Status: AC

## 2017-11-12 NOTE — Addendum Note (Signed)
Addended by: Frances Maywood'JERNES, Yomar Mejorado M on: 11/12/2017 02:48 PM   Modules accepted: Orders

## 2018-02-05 ENCOUNTER — Ambulatory Visit: Payer: Self-pay | Admitting: Family Medicine

## 2018-02-05 VITALS — BP 133/75 | HR 56 | Resp 16 | Ht 68.0 in | Wt 196.0 lb

## 2018-02-05 DIAGNOSIS — Z008 Encounter for other general examination: Secondary | ICD-10-CM

## 2018-02-05 DIAGNOSIS — Z0189 Encounter for other specified special examinations: Principal | ICD-10-CM

## 2018-02-05 NOTE — Progress Notes (Signed)
Subjective: Annual biometrics screening  Patient presents for his annual biometric screening. Patient reports generally eating a healthy, well-rounded diet but that there is room for improvement.  Patient regularly exercises.  Patient has a family history of diabetes but denies any personal history of this.  Patient reports his normal heart rate is around 50. Denies any symptoms related to this.  Patient does not regularly see a primary care provider. PCP: None currently. Patient denies any other issues or concerns.   Review of Systems Unremarkable  Objective  Physical Exam General: Awake, alert and oriented. No acute distress. Well developed, hydrated and nourished. Appears stated age.  HEENT: Supple neck without adenopathy. Sclera is non-icteric. The ear canal is clear without discharge. The tympanic membrane is normal in appearance with normal landmarks and cone of light. Nasal mucosa is pink and moist. Oral mucosa is pink and moist. The pharynx is normal in appearance without tonsillar swelling or exudates.  Skin: Skin in warm, dry and intact without rashes or lesions. Appropriate color for ethnicity. Cardiac: Heart rate 56.  Normal rhythm. No murmurs, gallops, or rubs are auscultated.  Respiratory: The chest wall is symmetric and without deformity. No signs of respiratory distress. Lung sounds are clear in all lobes bilaterally without rales, ronchi, or wheezes.  Neurological: The patient is awake, alert and oriented to person, place, and time with normal speech.  Memory is normal and thought processes intact. No gait abnormalities are appreciated.  Psychiatric: Appropriate mood and affect.   Assessment Annual biometrics screening  Plan  Lipid panel and fasting blood sugar pending. Encouraged routine visits with primary care provider.  Provided patient with resources and encouraged him to establish care with a primary care provider within the next month.  Advised patient that refills need  to come from a primary care provider for his long-term medications, which require monitoring. Encouraged patient to get regular exercise and eat a healthy, well-rounded diet.

## 2018-02-06 LAB — LIPID PANEL
CHOL/HDL RATIO: 4.6 ratio (ref 0.0–5.0)
CHOLESTEROL TOTAL: 196 mg/dL (ref 100–199)
HDL: 43 mg/dL (ref >39)
LDL CALC: 134 mg/dL — AB (ref 0–99)
TRIGLYCERIDES: 95 mg/dL (ref 0–149)
VLDL Cholesterol Cal: 19 mg/dL (ref 5–40)

## 2018-02-06 LAB — GLUCOSE, RANDOM: Glucose: 99 mg/dL (ref 65–99)

## 2018-02-06 NOTE — Progress Notes (Signed)
Dear Mr. Howard Decker, I wanted to let you know that your lipid panel and fasting blood sugar came back.  Everything is normal, with the exception of your LDL cholesterol. Your LDL cholesterol ("bad cholesterol") is elevated at 134, normal values are below 99.   I want you to follow-up with your primary care provider regarding these results.

## 2018-04-15 ENCOUNTER — Ambulatory Visit: Payer: Self-pay | Admitting: Family Medicine

## 2018-04-15 VITALS — BP 127/73 | HR 56 | Temp 97.8°F | Resp 14

## 2018-04-15 DIAGNOSIS — J069 Acute upper respiratory infection, unspecified: Secondary | ICD-10-CM

## 2018-04-15 DIAGNOSIS — J0100 Acute maxillary sinusitis, unspecified: Secondary | ICD-10-CM

## 2018-04-15 NOTE — Progress Notes (Signed)
Copy of Rx given

## 2018-04-15 NOTE — Progress Notes (Signed)
Subjective: congestion     Howard Decker is a 32 y.o. male who presents for evaluation of nasal congestion, sore throat, hoarse voice, facial pressure, and mild productive cough for 5 days.  Patient reports symptoms have remained stable.  Patient has a history of allergic rhinitis, which he takes Sports coachAllegra and Flonase for.  Patient has seen ENT in the past, who recommended daily nasal saline use for sinusitis prevention but the patient reports poor compliance with this. Treatment to date: None.  Denies rash, nausea, vomiting, diarrhea, SOB, wheezing, chest or back pain, ear pain, difficulty swallowing, confusion, headache, body aches, fatigue, fever, chills, severe symptoms, or initial improvement and then worsening of symptoms. History of smoking, asthma, COPD: Positive for asthma only.  Patient takes Symbicort daily.  Does not have a rescue inhaler and reports he has never needed this.  Denies any respiratory symptoms currently other than a mild cough. History of recurrent sinus and/or lung infections: Reports getting 1-2 sinus infections each year.  Denies any history of lung infections. Antibiotic use in the last 3 months: Negative.   Review of Systems Pertinent items noted in HPI and remainder of comprehensive ROS otherwise negative.     Objective:   Physical Exam General: Awake, alert, and oriented. No acute distress. Well developed, hydrated and nourished. Appears stated age. Nontoxic appearance.  HEENT:  PND noted.  Mild erythema to posterior oropharynx.  No edema or exudates of pharynx or tonsils. No erythema or bulging of TM.  Mild erythema/edema to nasal mucosa.  Mild bilateral maxillary sinus tenderness.  Remainder of sinuses nontender. Supple neck without adenopathy. Cardiac: Heart rate and rhythm are normal. No murmurs, gallops, or rubs are auscultated. S1 and S2 are heard and are of normal intensity.  Respiratory: No signs of respiratory distress. Lungs clear. No tachypnea. Able to  speak in full sentences without dyspnea. Nonlabored respirations.  Skin: Skin is warm, dry and intact. Appropriate color for ethnicity. No cyanosis noted.    Assessment:    sinusitis and viral upper respiratory illness   Plan:    Discussed diagnosis and treatment of URI. Discussed the diagnosis and treatment of sinusitis. Discussed the importance of avoiding unnecessary antibiotic therapy. Suggested symptomatic OTC remedies. Nasal saline spray for congestion. Recommended voice rest. Prescribed a delayed prescription for Augmentin.  Provided patient with instructions regarding indications to fill this. Provided patient with written prescription, per his request.  See scanned copy of this prescription.  Patient has taken this in the past many times and tolerated it well.  Discussed side/adverse effects. Discussed red flag symptoms and circumstances with which to seek medical care.

## 2018-05-12 ENCOUNTER — Other Ambulatory Visit: Payer: Self-pay | Admitting: Physician Assistant

## 2018-08-11 ENCOUNTER — Ambulatory Visit: Payer: Self-pay | Admitting: Emergency Medicine

## 2018-08-11 VITALS — BP 118/82 | HR 62 | Temp 98.3°F | Resp 14 | Ht 68.0 in | Wt 210.0 lb

## 2018-08-11 DIAGNOSIS — J0101 Acute recurrent maxillary sinusitis: Secondary | ICD-10-CM

## 2018-08-11 MED ORDER — AMOXICILLIN-POT CLAVULANATE 875-125 MG PO TABS: 1.0000 | ORAL_TABLET | Freq: Two times a day (BID) | ORAL | 0 refills | Status: DC

## 2018-08-11 NOTE — Patient Instructions (Signed)

## 2018-08-11 NOTE — Progress Notes (Addendum)
Subjective: Patient has a long history of recurrent sinus infections.  He typically takes Augmentin with good results.  He is on a regular regimen of saline flush, Flonase, and uses Symbicort for reactive airways disease.  He has never had improvement with use of the albuterol.  He has had to take prednisone in the past but typically clears with Augmentin.  He has been to ENT in the past. Review of systems: He does have some cough.  His primary issue has been sinus congestion with postnasal drip.  He has not been ill with fever or chills. Objective: General alert cooperative in no distress. Neck supple without adenopathy. TMs clear. Sinuses tender right maxillary area. Chest occasional rhonchi on the right no wheezes heard. Assessment:. Patient presents with recurrent sinus disease.  He has responded well to Augmentin in the past.  We will hold off on prednisone at the present time and treat with antibiotics alone.  He had diarrhea with Omnicef but has never had problems with Augmentin. Plan: Augmentin 875 twice daily #20. Saline flush. Continue Flonase. Continue Symbicort.   08/14/2018 9:30 AM addendum: I received message that called back, he requested we prescribe prednisone.  I reviewed above notes. Prednisone 10 mg - 6-day Dosepak E prescribed.-[facility], which is the most recent pharmacy of record. Should follow-up with PCP if not improved in 5 to 7 days  08/14/2018 11:18 AM.-At patient request, changed prednisone 10 mg 6-day Dosepak pharmacy, [facility] [place]

## 2018-08-14 ENCOUNTER — Telehealth: Payer: Self-pay

## 2018-08-14 MED ORDER — PREDNISONE 10 MG PO TABS: ORAL_TABLET | ORAL | 0 refills | Status: DC

## 2018-08-14 MED ORDER — PREDNISONE 10 MG (21) PO TBPK: ORAL_TABLET | ORAL | 0 refills | Status: DC

## 2018-08-14 NOTE — Addendum Note (Signed)
Addended byLajean Manes: MASSEY, DAVID B on: 08/14/2018 11:21 AM   Modules accepted: Orders

## 2018-08-14 NOTE — Telephone Encounter (Signed)
The patient called this morning and at his visit on 08/11/18 had mentioned that he wanted to hold off on the steroid being prescribed but now feels he may need one. A steroid has been called [facility]. After notifying the patient of the medicine being called in the patient requested a change in Pharm[facility] [address] [place]

## 2018-08-14 NOTE — Addendum Note (Signed)
Addended by: Lajean ManesMASSEY, DAVID B on: 08/14/2018 09:33 AM   Modules accepted: Orders

## 2019-08-03 ENCOUNTER — Other Ambulatory Visit: Payer: Self-pay

## 2019-08-03 DIAGNOSIS — Z20822 Contact with and (suspected) exposure to covid-19: Secondary | ICD-10-CM

## 2019-08-05 LAB — NOVEL CORONAVIRUS, NAA: SARS-CoV-2, NAA: NOT DETECTED

## 2020-04-11 ENCOUNTER — Other Ambulatory Visit: Payer: Self-pay

## 2020-04-11 ENCOUNTER — Ambulatory Visit
Admission: EM | Admit: 2020-04-11 | Discharge: 2020-04-11 | Disposition: A | Discharge status: Home / Self Care | Payer: BC Managed Care – PPO | Attending: Emergency Medicine | Admitting: Emergency Medicine

## 2020-04-11 DIAGNOSIS — Z79899 Other long term (current) drug therapy: Secondary | ICD-10-CM | POA: Insufficient documentation

## 2020-04-11 DIAGNOSIS — R0982 Postnasal drip: Secondary | ICD-10-CM | POA: Insufficient documentation

## 2020-04-11 DIAGNOSIS — J029 Acute pharyngitis, unspecified: Secondary | ICD-10-CM | POA: Diagnosis present

## 2020-04-11 DIAGNOSIS — E079 Disorder of thyroid, unspecified: Secondary | ICD-10-CM | POA: Diagnosis not present

## 2020-04-11 DIAGNOSIS — U071 COVID-19: Secondary | ICD-10-CM | POA: Insufficient documentation

## 2020-04-11 DIAGNOSIS — J0140 Acute pansinusitis, unspecified: Secondary | ICD-10-CM

## 2020-04-11 MED ORDER — AMOXICILLIN-POT CLAVULANATE 875-125 MG PO TABS
1.0000 | ORAL_TABLET | Freq: Two times a day (BID) | ORAL | 0 refills | Status: DC
Start: 2020-04-11 — End: 2020-05-06

## 2020-04-11 NOTE — Discharge Instructions (Addendum)
Continue sinus rinse, Flonase, Mucinex or Mucinex D.  Wait for your Covid test results prior to starting the Augmentin.  If negative, go ahead and start it.  If positive, then antibiotics will not help.  We will call you only if your Covid test is positive.

## 2020-04-11 NOTE — ED Triage Notes (Signed)
Patient presents w/ c/o nasal congestion, sinus pressure, and slight cough sx onset today. Patient denies fever.

## 2020-04-11 NOTE — ED Provider Notes (Signed)
HPI  SUBJECTIVE:  Howard Decker is a 33 y.o. male who presents with 4 days of "sinus infection".  He reports sinus pain/pressure, green-yellow rhinorrhea, nasal congestion, mild sore throat secondary to postnasal drip, postnasal drip.  Denies body aches, headaches, fevers, facial swelling, upper dental pain.  No loss of sense of smell or taste, cough, shortness of breath, nausea, vomiting, diarrhea, abdominal pain, exposure to Covid.  He has not gotten the Covid vaccine.  He tried Mucinex, warm showers and started saline nasal irrigation yesterday.  He also does Flonase on a regular basis.  Warm showers help.  No aggravating factors.  Thinks that this was triggered by dipping tobacco and by mowing the grass without a mask on-both are known triggers.  He states this is identical to previous episodes of sinusitis.  States that he had a history of frequent sinusitis, but has not had one since 2019.  No history of diabetes, hypertension.  PMD: Dr. Arlana Pouch at [facility] internal medicine   Past Medical History:  Diagnosis Date  . Allergy   . Thyroid disease     History reviewed. No pertinent surgical history.  Family History  Problem Relation Age of Onset  . Hypothyroidism Mother   . Diabetes Father   . Heart disease Father   . Hypertension Father   . Alcohol abuse Brother     Social History   Tobacco Use  . Smoking status: Never Smoker  . Smokeless tobacco: Current User    Types: Snuff  Vaping Use  . Vaping Use: Never used  Substance Use Topics  . Alcohol use: Yes  . Drug use: No    No current facility-administered medications for this encounter.  Current Outpatient Medications:  .  fluticasone (FLONASE) 50 MCG/ACT nasal spray, SHAKE LIQUID AND USE 2 SPRAYS IN EACH NOSTRIL EVERY DAY, Disp: 16 g, Rfl: 12 .  levothyroxine (SYNTHROID, LEVOTHROID) 150 MCG tablet, TAKE 1 TABLET(150 MCG) BY MOUTH DAILY, Disp: 30 tablet, Rfl: 6 .  SYMBICORT 80-4.5 MCG/ACT inhaler, INHALE 2 PUFFS INTO THE  LUNGS TWICE DAILY., Disp: 10.2 g, Rfl: 12 .  amoxicillin-clavulanate (AUGMENTIN) 875-125 MG tablet, Take 1 tablet by mouth 2 (two) times daily. X 7 days, Disp: 14 tablet, Rfl: 0 .  amphetamine-dextroamphetamine (ADDERALL) 30 MG tablet, Take 1 tablet by mouth 2 (two) times daily., Disp: , Rfl:   Allergies  Allergen Reactions  . Omnicef [Cefdinir] Diarrhea     ROS  As noted in HPI.   Physical Exam  BP 128/89 (BP Location: Left Arm)   Pulse 78   Temp 98.4 F (36.9 C) (Oral)   Resp 18   Ht 5\' 8"  (1.727 m)   Wt 83.9 kg   SpO2 100%   BMI 28.13 kg/m   Constitutional: Well developed, well nourished, no acute distress Eyes:  EOMI, conjunctiva normal bilaterally HENT: Normocephalic, atraumatic,mucus membranes moist.  Erythematous, but not swollen turbinates.  No purulent nasal congestion.  No frontal or maxillary sinus tenderness.  Reports "pressure".  Positive cobblestoning and postnasal drip.  Normal oropharynx. Respiratory: Normal inspiratory effort, lungs clear bilaterally Cardiovascular: Normal rate regular rhythm GI: nondistended skin: No rash, skin intact Musculoskeletal: no deformities Neurologic: Alert & oriented x 3, no focal neuro deficits Psychiatric: Speech and behavior appropriate   ED Course   Medications - No data to display  Orders Placed This Encounter  Procedures  . SARS CORONAVIRUS 2 (TAT 6-24 HRS) Nasopharyngeal Nasopharyngeal Swab    Standing Status:   Standing  Number of Occurrences:   1    Order Specific Question:   Is this test for diagnosis or screening    Answer:   Diagnosis of ill patient    Order Specific Question:   Symptomatic for COVID-19 as defined by CDC    Answer:   Yes    Order Specific Question:   Date of Symptom Onset    Answer:   04/08/2020    Order Specific Question:   Hospitalized for COVID-19    Answer:   No    Order Specific Question:   Admitted to ICU for COVID-19    Answer:   No    Order Specific Question:   Previously  tested for COVID-19    Answer:   No    Order Specific Question:   Resident in a congregate (group) care setting    Answer:   No    Order Specific Question:   Employed in healthcare setting    Answer:   No    Order Specific Question:   Has patient completed COVID vaccination(s) (2 doses of Pfizer/Moderna 1 dose of [employer])    Answer:   No    No results found for this or any previous visit (from the past 24 hour(s)). No results found.  ED Clinical Impression  1. Acute non-recurrent pansinusitis      ED Assessment/Plan  Patient with sinusitis-states this is identical to previous sinus infections.  Covid PCR test sent.  Advised patient to wait till Covid test is resulted prior to starting antibiotics.  If positive, he is to do supportive treatment and we discussed that antibiotics will not help.  If negative, he can start the Augmentin.  Do not see any purulent drainage, but he has been doing regular saline nasal irrigation.  Continue Flonase, saline nasal irrigation, Mucinex or Mucinex D.  He declined prescription of prednisone.  He is to call here and get his Covid results.  Discussed that we will only call him if it is positive.    Discussed labs,  MDM, treatment plan, and plan for follow-up with patient. Discussed sn/sx that should prompt return to the ED. patient agrees with plan.   Meds ordered this encounter  Medications  . amoxicillin-clavulanate (AUGMENTIN) 875-125 MG tablet    Sig: Take 1 tablet by mouth 2 (two) times daily. X 7 days    Dispense:  14 tablet    Refill:  0    *This clinic note was created using Scientist, clinical (histocompatibility and immunogenetics). Therefore, there may be occasional mistakes despite careful proofreading.   ?    Domenick Gong, MD 04/11/20 1818

## 2020-04-12 LAB — SARS CORONAVIRUS 2 (TAT 6-24 HRS): SARS Coronavirus 2: POSITIVE — AB

## 2020-05-06 ENCOUNTER — Ambulatory Visit
Admission: EM | Admit: 2020-05-06 | Discharge: 2020-05-06 | Disposition: A | Discharge status: Home / Self Care | Payer: BC Managed Care – PPO | Attending: Internal Medicine | Admitting: Internal Medicine

## 2020-05-06 ENCOUNTER — Other Ambulatory Visit: Payer: Self-pay

## 2020-05-06 DIAGNOSIS — M629 Disorder of muscle, unspecified: Secondary | ICD-10-CM

## 2020-05-06 NOTE — ED Triage Notes (Addendum)
Patient in today w/ c/o sore throat. Patient states the pain is on the outside of his neck. Patient states it started yesterday and "doesn't feel like it's internal." Patient also states he tested positive for COVID 8/3.  Patient also requests lab work for thyroid.

## 2020-05-06 NOTE — Discharge Instructions (Addendum)
This is likely a sprain of one of the muscles in the neck Gentle range of motion exercises Ibuprofen as needed for pain If symptoms worsen please return to the urgent care to be reevaluated.

## 2020-05-08 NOTE — ED Provider Notes (Signed)
[facility]    CSN: [id number] Arrival date & time: 05/06/20  0855      History   Chief Complaint Chief Complaint  Patient presents with  . Sore Throat    HPI Howard Decker is a 34 y.o. male comes to the urgent care with complaints of sore throat.  Pain has been on the right side of the neck.  Started yesterday.  Patient has recently tested positive for COVID-19 on 8/3.  Patient has recovered from that infection.  No sore throat.  No difficulty swallowing.  No fever, chills, body aches.  Patient has hypothyroidism on levothyroxine but has not checked his thyroid function in the past 2 to 3 years.  No headaches, dizziness, visual changes or weakness/numbness in the right upper extremities.[name]   HPI  Past Medical History:  Diagnosis Date  . Allergy   . Thyroid disease     Patient Active Problem List   Diagnosis Date Noted  . Asthma 08/21/2016  . Hypothyroidism 08/21/2016  . Chronic pansinusitis 11/13/2015    History reviewed. No pertinent surgical history.     Home Medications    Prior to Admission medications   Medication Sig Start Date End Date Taking? Authorizing Provider  amphetamine-dextroamphetamine (ADDERALL) 30 MG tablet Take 1 tablet by mouth 2 (two) times daily. 03/22/20  Yes [provider]  fluticasone (FLONASE) 50 MCG/ACT nasal spray SHAKE LIQUID AND USE 2 SPRAYS IN EACH NOSTRIL EVERY DAY 07/04/17  Yes Fisher, Roselyn Bering, PA-C  levothyroxine (SYNTHROID, LEVOTHROID) 150 MCG tablet TAKE 1 TABLET(150 MCG) BY MOUTH DAILY 09/15/17  Yes Nona Dell K, PA-C  SYMBICORT 80-4.5 MCG/ACT inhaler INHALE 2 PUFFS INTO THE LUNGS TWICE DAILY. 07/04/17  Yes Faythe Ghee, PA-C    Family History Family History  Problem Relation Age of Onset  . Hypothyroidism Mother   . Diabetes Father   . Heart disease Father   . Hypertension Father   . Alcohol abuse Brother     Social History Social History   Tobacco Use  . Smoking status: Never Smoker  .  Smokeless tobacco: Current User    Types: Snuff  Vaping Use  . Vaping Use: Never used  Substance Use Topics  . Alcohol use: Yes  . Drug use: No     Allergies   Omnicef [cefdinir]   Review of Systems Review of Systems  Constitutional: Negative.   HENT: Negative.   Respiratory: Negative.   Gastrointestinal: Negative.   Genitourinary: Negative.   Musculoskeletal: Negative.   Skin: Negative.      Physical Exam Triage Vital Signs ED Triage Vitals  Enc Vitals Group     BP 05/06/20 0910 124/86     Pulse Rate 05/06/20 0910 61     Resp 05/06/20 0910 18     Temp 05/06/20 0910 98.2 F (36.8 C)     Temp Source 05/06/20 0910 Oral     SpO2 05/06/20 0910 100 %     Weight 05/06/20 0911 180 lb (81.6 kg)     Height 05/06/20 0911 5\' 8"  (1.727 m)     Head Circumference --      Peak Flow --      Pain Score 05/06/20 0910 6     Pain Loc --      Pain Edu? --      Excl. in GC? --    No data found.  Updated Vital Signs BP 124/86 (BP Location: Right Arm)   Pulse 61   Temp  98.2 F (36.8 C) (Oral)   Resp 18   Ht 5\' 8"  (1.727 m)   Wt 81.6 kg   SpO2 100%   BMI 27.37 kg/m   Visual Acuity Right Eye Distance:   Left Eye Distance:   Bilateral Distance:    Right Eye Near:   Left Eye Near:    Bilateral Near:     Physical Exam Constitutional:      General: He is not in acute distress.    Appearance: He is well-developed. He is not ill-appearing.  HENT:     Right Ear: Tympanic membrane is not erythematous.     Left Ear: Tympanic membrane is not erythematous.     Nose: No rhinorrhea.     Mouth/Throat:     Pharynx: No posterior oropharyngeal erythema or uvula swelling.     Tonsils: No tonsillar exudate or tonsillar abscesses.  Cardiovascular:     Rate and Rhythm: Normal rate.  Pulmonary:     Effort: Pulmonary effort is normal.     Breath sounds: Normal breath sounds. No wheezing or rhonchi.  Abdominal:     General: Bowel sounds are normal.     Palpations: Abdomen is  soft.     Tenderness: There is no abdominal tenderness. There is no guarding.  Neurological:     Mental Status: He is alert.      UC Treatments / Results  Labs (all labs ordered are listed, but only abnormal results are displayed) Labs Reviewed - No data to display  EKG   Radiology No results found.  Procedures Procedures (including critical care time)  Medications Ordered in UC Medications - No data to display  Initial Impression / Assessment and Plan / UC Course  I have reviewed the triage vital signs and the nursing notes.  Pertinent labs & imaging results that were available during my care of the patient were reviewed by me and considered in my medical decision making (see chart for details).     1.  Pain on right side of the neck: Likely muscle sprain NSAIDs as needed for pain To evaluate for possible thyroiditis I offered patient free T4/TSH testing Return precautions given. Final Clinical Impressions(s) / UC Diagnoses   Final diagnoses:  Omohyoid sling syndrome     Discharge Instructions     This is likely a sprain of one of the muscles in the neck Gentle range of motion exercises Ibuprofen as needed for pain If symptoms worsen please return to the urgent care to be reevaluated.   ED Prescriptions    None     PDMP not reviewed this encounter.   , MD 05/08/20 1438

## 2020-05-25 ENCOUNTER — Ambulatory Visit
Admission: EM | Admit: 2020-05-25 | Discharge: 2020-05-25 | Disposition: A | Discharge status: Home / Self Care | Payer: BC Managed Care – PPO | Attending: Family Medicine | Admitting: Family Medicine

## 2020-05-25 ENCOUNTER — Other Ambulatory Visit: Payer: Self-pay

## 2020-05-25 ENCOUNTER — Ambulatory Visit (INDEPENDENT_AMBULATORY_CARE_PROVIDER_SITE_OTHER): Payer: BC Managed Care – PPO

## 2020-05-25 DIAGNOSIS — S99912A Unspecified injury of left ankle, initial encounter: Secondary | ICD-10-CM

## 2020-05-25 DIAGNOSIS — S8252XA Displaced fracture of medial malleolus of left tibia, initial encounter for closed fracture: Secondary | ICD-10-CM | POA: Diagnosis not present

## 2020-05-25 DIAGNOSIS — M25572 Pain in left ankle and joints of left foot: Secondary | ICD-10-CM | POA: Diagnosis not present

## 2020-05-25 DIAGNOSIS — S8992XA Unspecified injury of left lower leg, initial encounter: Secondary | ICD-10-CM

## 2020-05-25 MED ORDER — MELOXICAM 15 MG PO TABS: 15.0000 mg | ORAL_TABLET | Freq: Every day | ORAL | 0 refills | Status: AC | PRN

## 2020-05-25 NOTE — ED Provider Notes (Signed)
[facility]    CSN: [id number] Arrival date & time: 05/25/20  1714      History   Chief Complaint Chief Complaint  Patient presents with  . Ankle Pain    Left    HPI  34 year old male presents with the above complaint.  Patient reports that he was playing basketball with his nephew on Tuesday.  He accidentally twisted his left ankle.  He has bruising and pain around the medial malleolus.  Patient also reports pain of the left lateral knee particular around the head of the fibula.  Pain 6/10 in severity.  He is able to bear weight.  No relieving factors.  No other reported injuries.  No other complaints concerns this time.  Past Medical History:  Diagnosis Date  . Allergy   . Thyroid disease    Patient Active Problem List   Diagnosis Date Noted  . Asthma 08/21/2016  . Hypothyroidism 08/21/2016  . Chronic pansinusitis 11/13/2015   Home Medications    Prior to Admission medications   Medication Sig Start Date End Date Taking? Authorizing Provider  amphetamine-dextroamphetamine (ADDERALL) 30 MG tablet Take 1 tablet by mouth 2 (two) times daily. 03/22/20  Yes [provider]  fluticasone (FLONASE) 50 MCG/ACT nasal spray SHAKE LIQUID AND USE 2 SPRAYS IN EACH NOSTRIL EVERY DAY 07/04/17  Yes Fisher, Roselyn Bering, PA-C  levothyroxine (SYNTHROID, LEVOTHROID) 150 MCG tablet TAKE 1 TABLET(150 MCG) BY MOUTH DAILY 09/15/17  Yes Nona Dell K, PA-C  SYMBICORT 80-4.5 MCG/ACT inhaler INHALE 2 PUFFS INTO THE LUNGS TWICE DAILY. 07/04/17  Yes Fisher, Roselyn Bering, PA-C  meloxicam (MOBIC) 15 MG tablet Take 1 tablet (15 mg total) by mouth daily as needed for pain. 05/25/20   Tommie Sams, DO    Family History Family History  Problem Relation Age of Onset  . Hypothyroidism Mother   . Diabetes Father   . Heart disease Father   . Hypertension Father   . Alcohol abuse Brother     Social History Social History   Tobacco Use  . Smoking status: Never Smoker  . Smokeless  tobacco: Current User    Types: Snuff  Vaping Use  . Vaping Use: Never used  Substance Use Topics  . Alcohol use: Yes  . Drug use: No     Allergies   Omnicef [cefdinir]   Review of Systems Review of Systems  Musculoskeletal:       Left ankle pain, left knee pain.   Physical Exam Triage Vital Signs ED Triage Vitals  Enc Vitals Group     BP 05/25/20 1746 134/89     Pulse Rate 05/25/20 1746 79     Resp 05/25/20 1746 18     Temp 05/25/20 1746 98.2 F (36.8 C)     Temp Source 05/25/20 1746 Oral     SpO2 05/25/20 1746 100 %     Weight 05/25/20 1745 185 lb (83.9 kg)     Height 05/25/20 1745 5\' 8"  (1.727 m)     Head Circumference --      Peak Flow --      Pain Score 05/25/20 1744 6     Pain Loc --      Pain Edu? --      Excl. in GC? --    Updated Vital Signs BP 134/89 (BP Location: Left Arm)   Pulse 79   Temp 98.2 F (36.8 C) (Oral)   Resp 18   Ht 5\' 8"  (1.727 m)  Wt 83.9 kg   SpO2 100%   BMI 28.13 kg/m   Visual Acuity Right Eye Distance:   Left Eye Distance:   Bilateral Distance:    Right Eye Near:   Left Eye Near:    Bilateral Near:     Physical Exam Constitutional:      General: He is not in acute distress.    Appearance: Normal appearance. He is not ill-appearing.  HENT:     Head: Normocephalic and atraumatic.  Eyes:     General:        Right eye: No discharge.        Left eye: No discharge.     Conjunctiva/sclera: Conjunctivae normal.  Pulmonary:     Effort: Pulmonary effort is normal. No respiratory distress.  Musculoskeletal:       Feet:     Comments: Patient with tenderness over the fibular head.  Feet:     Comments: Extensive bruising noted at the labelled location.  Patient with tenderness over the left medial malleolus. Neurological:     Mental Status: He is alert.  Psychiatric:        Mood and Affect: Mood normal.        Behavior: Behavior normal.    UC Treatments / Results  Labs (all labs ordered are listed, but only  abnormal results are displayed) Labs Reviewed - No data to display  EKG   Radiology DG Ankle Complete Left  Result Date: 05/25/2020 CLINICAL DATA:  Left ankle inversion injury, left ankle pain EXAM: LEFT ANKLE COMPLETE - 3+ VIEW COMPARISON:  None. FINDINGS: Three view radiograph left ankle demonstrates a transverse fracture of the medial malleolus with mild displacement and near anatomic alignment of the fracture fragment and moderate overlying soft tissue swelling. The ankle mortise is intact. No other fracture or dislocation identified. IMPRESSION: Mildly displaced medial malleolar fracture with overlying soft tissue swelling. Electronically Signed   By: Helyn Numbers MD   On: 05/25/2020 19:07   DG Knee Complete 4 Views Left  Result Date: 05/25/2020 CLINICAL DATA:  Inversion injury Tuesday EXAM: LEFT KNEE - COMPLETE 4+ VIEW COMPARISON:  None. FINDINGS: No evidence of fracture, dislocation, or joint effusion. No evidence of arthropathy or other focal bone abnormality. Soft tissues are unremarkable. IMPRESSION: Negative. Electronically Signed   By: Jonna Clark M.D.   On: 05/25/2020 19:06    Procedures Procedures (including critical care time)  Medications Ordered in UC Medications - No data to display  Initial Impression / Assessment and Plan / UC Course  I have reviewed the triage vital signs and the nursing notes.  Pertinent labs & imaging results that were available during my care of the patient were reviewed by me and considered in my medical decision making (see chart for details).    34 year old male presents with an injury to the left ankle.  X-rays obtained and independently reviewed by me.  Interpretation: Mildly displaced medial malleolus fracture.  Surrounding soft tissue swelling.  No fractures of the knee noted.  This is a complicated injury.  Placed in splint.  Nonweightbearing.  Work for note given.  Advise rest, ice, elevation.  Patient is to follow-up with orthopedics.   Meloxicam as needed.  Supportive care.  Final Clinical Impressions(s) / UC Diagnoses   Final diagnoses:  Closed displaced fracture of medial malleolus of left tibia, initial encounter     Discharge Instructions     Rest, ice, elevate.  No weight bearing.  Please call [place] [facility] Orthopedics (  [phone]) tomorrow for an appt.  Take care  Dr. Adriana Simas     ED Prescriptions    Medication Sig Dispense Auth. Provider   meloxicam (MOBIC) 15 MG tablet Take 1 tablet (15 mg total) by mouth daily as needed for pain. 30 tablet Tommie Sams, DO     PDMP not reviewed this encounter.   Tommie Sams, [place] 05/25/20 2034

## 2020-05-25 NOTE — ED Triage Notes (Signed)
Patient in today w/ left ankle injury. Patient states he was playing basketball and rolled his ankle. Injury occurred on Tuesday.

## 2020-05-25 NOTE — Discharge Instructions (Signed)
Rest, ice, elevate.  No weight bearing.  Please call [facility] Orthopedics [phone]) tomorrow for an appt.  Take care  Dr. Adriana Simas

## 2021-05-08 IMAGING — CR DG KNEE COMPLETE 4+V*L*
4 series · 4 of 4 positions shown · non-contrast
Comparison: None.

CLINICAL DATA: Inversion injury [REDACTED]

EXAM:
LEFT KNEE - COMPLETE 4+ VIEW

[knee lat]
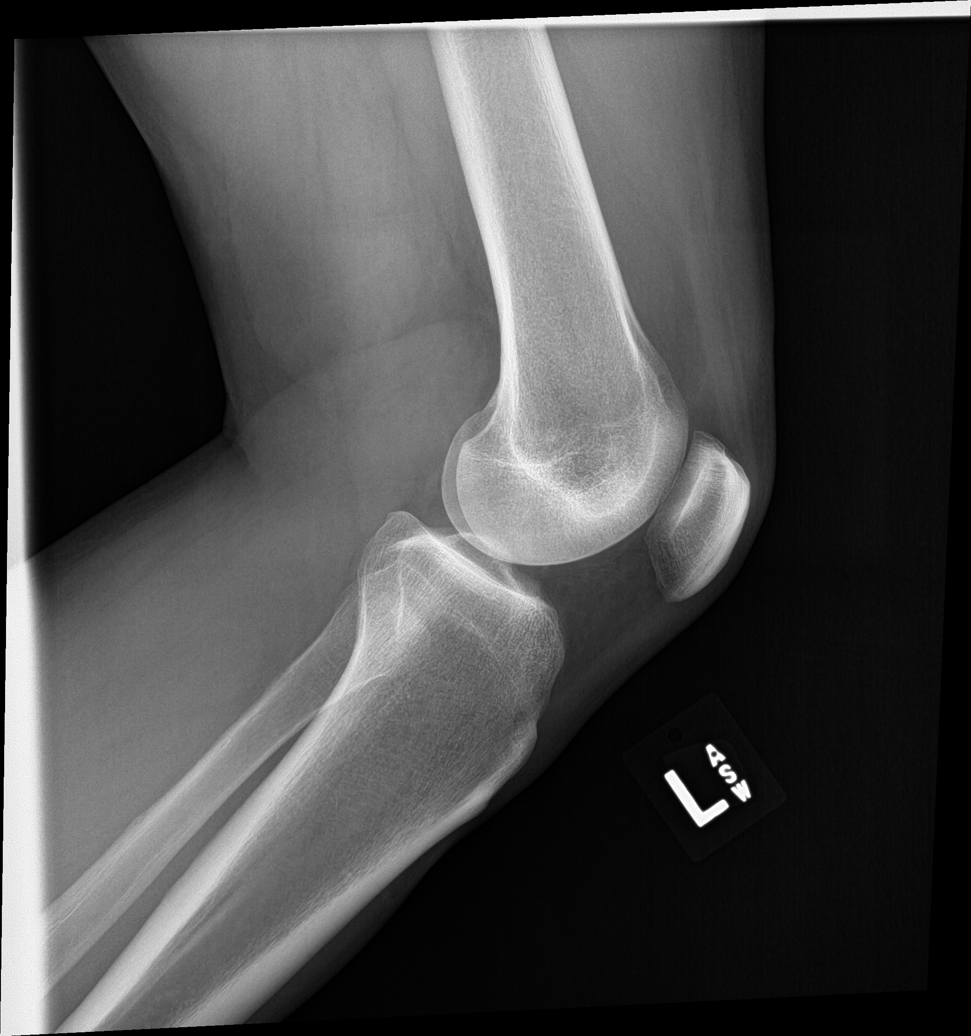

[knee ap]
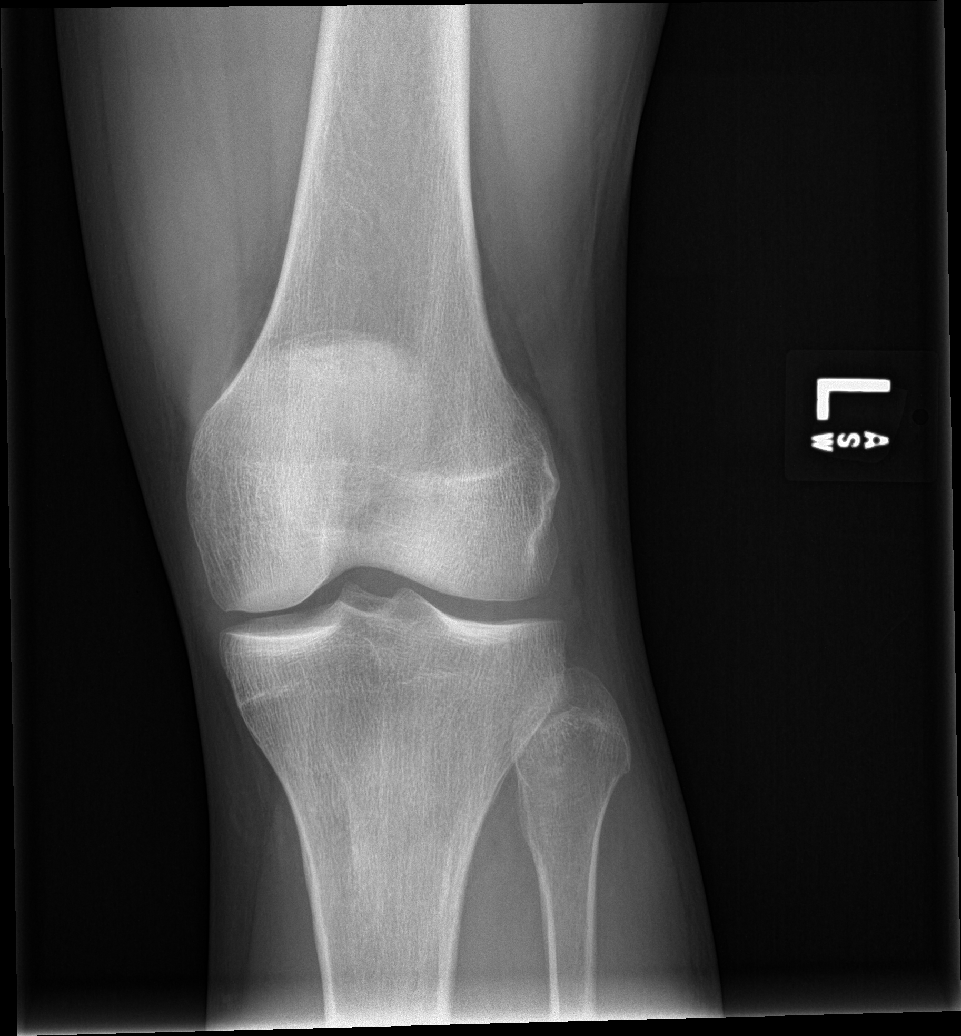

[knee obl (1 of 2)]
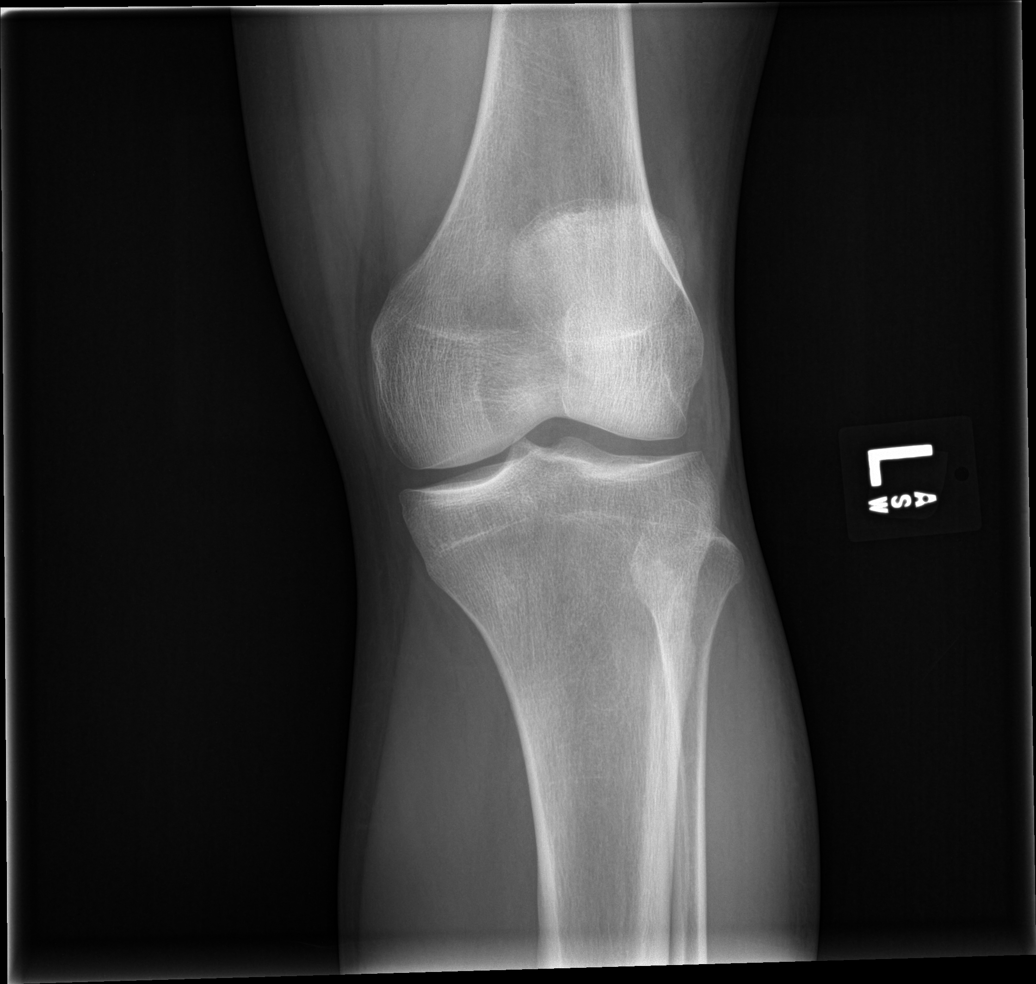

[knee obl (2 of 2)]
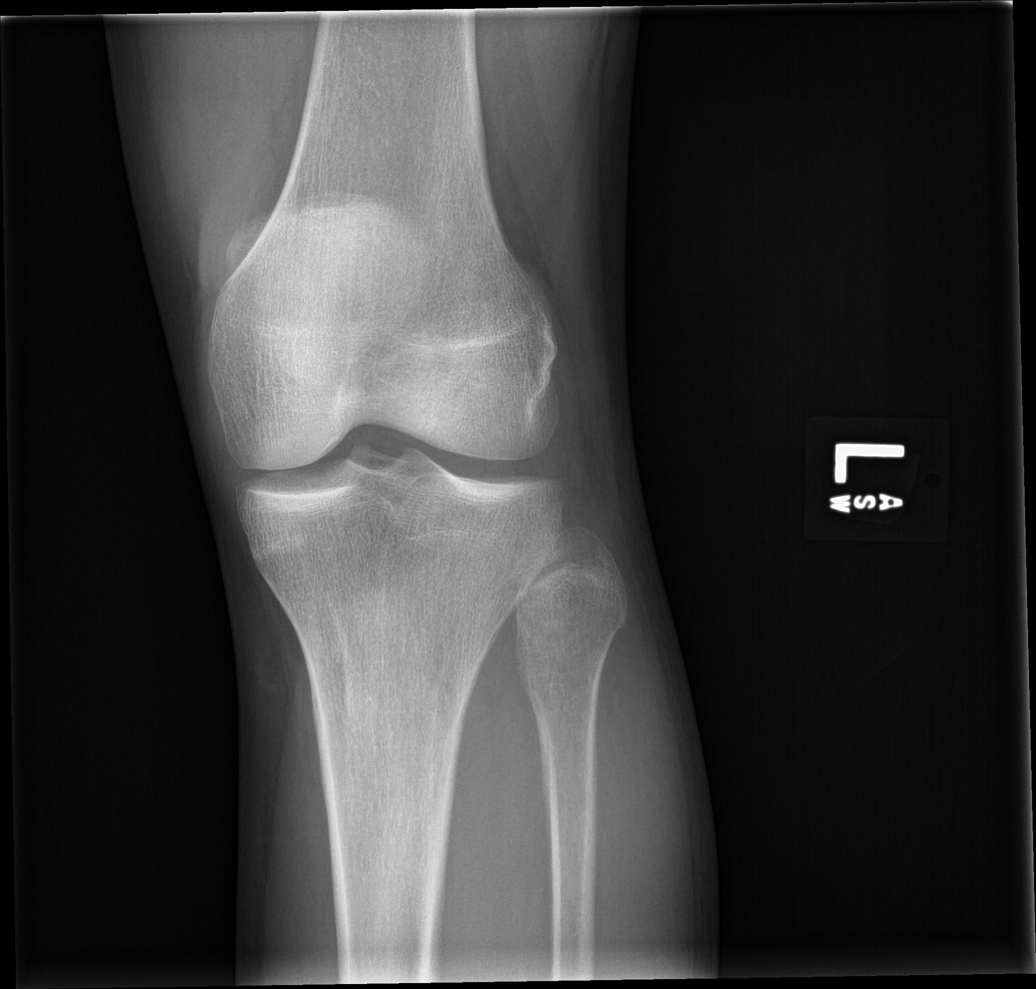

[4 of 4 positions shown; findings below may reference images not displayed]

FINDINGS: No evidence of fracture, dislocation, or joint effusion. No evidence
of arthropathy or other focal bone abnormality. Soft tissues are
unremarkable.
IMPRESSION: Negative.

## 2021-05-08 IMAGING — CR DG ANKLE COMPLETE 3+V*L*
3 series · 3 of 3 positions shown · non-contrast
Comparison: None.

CLINICAL DATA: Left ankle inversion injury, left ankle pain

EXAM:
LEFT ANKLE COMPLETE - 3+ VIEW

[ankle ap]
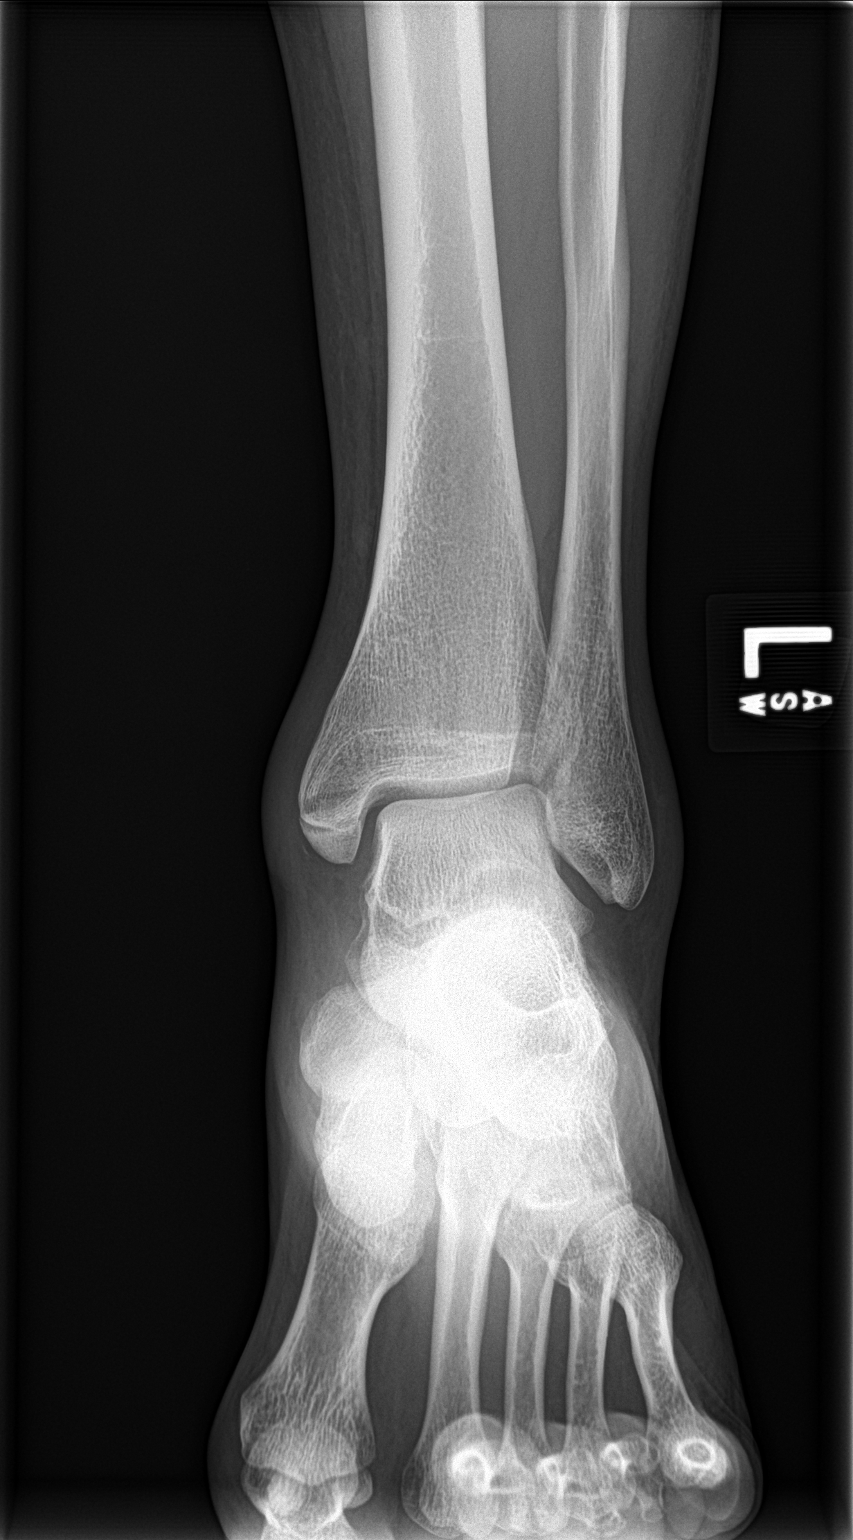

[ankle obl]
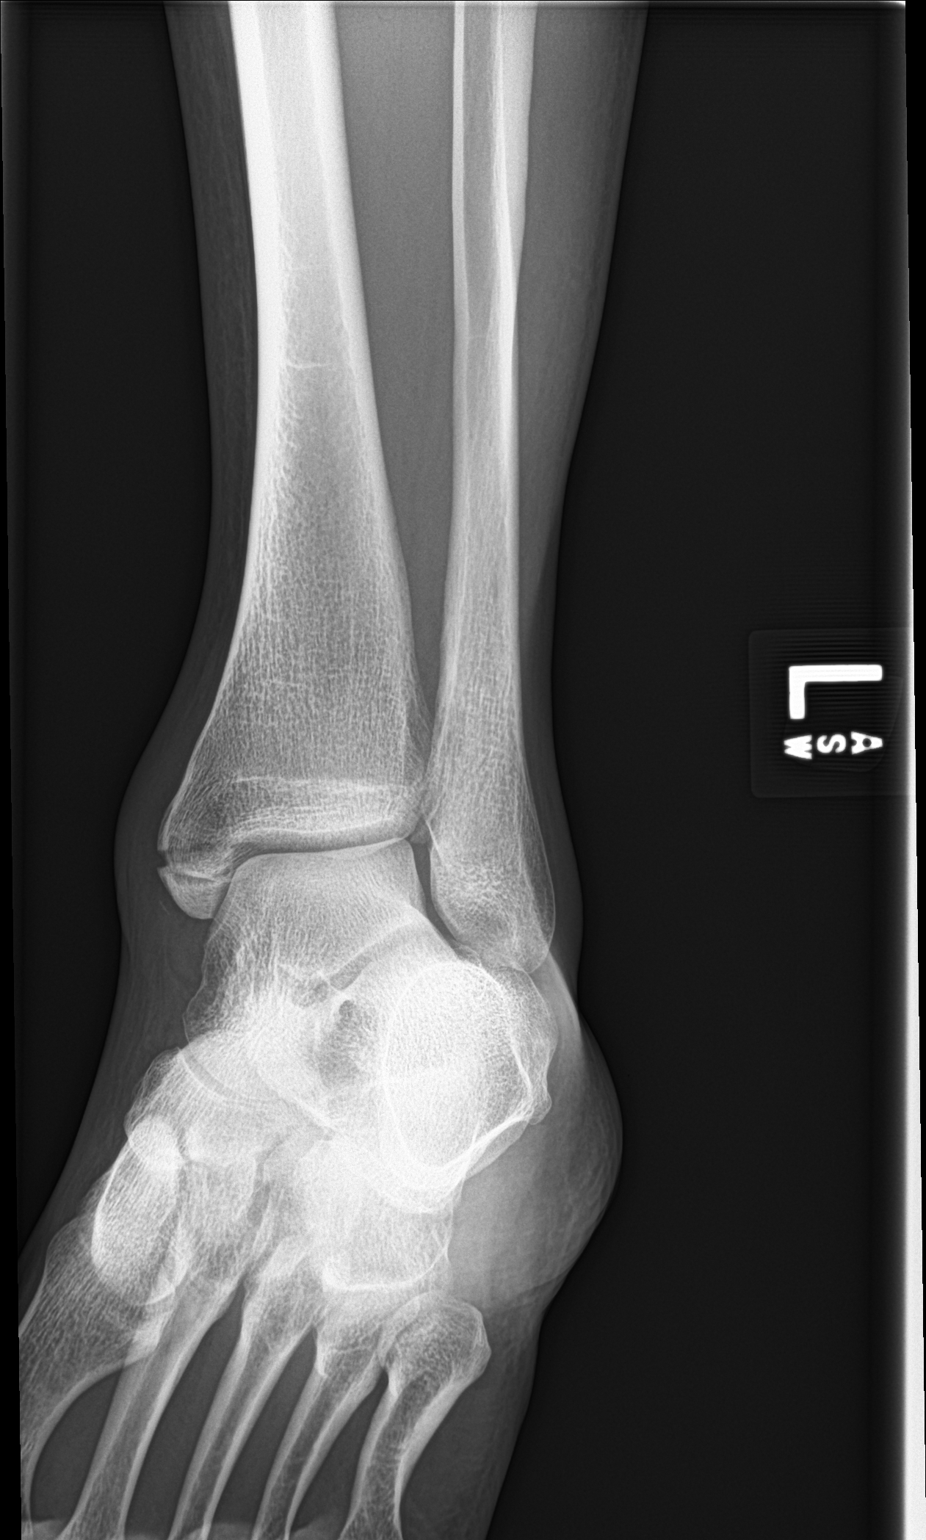

[ankle lat]
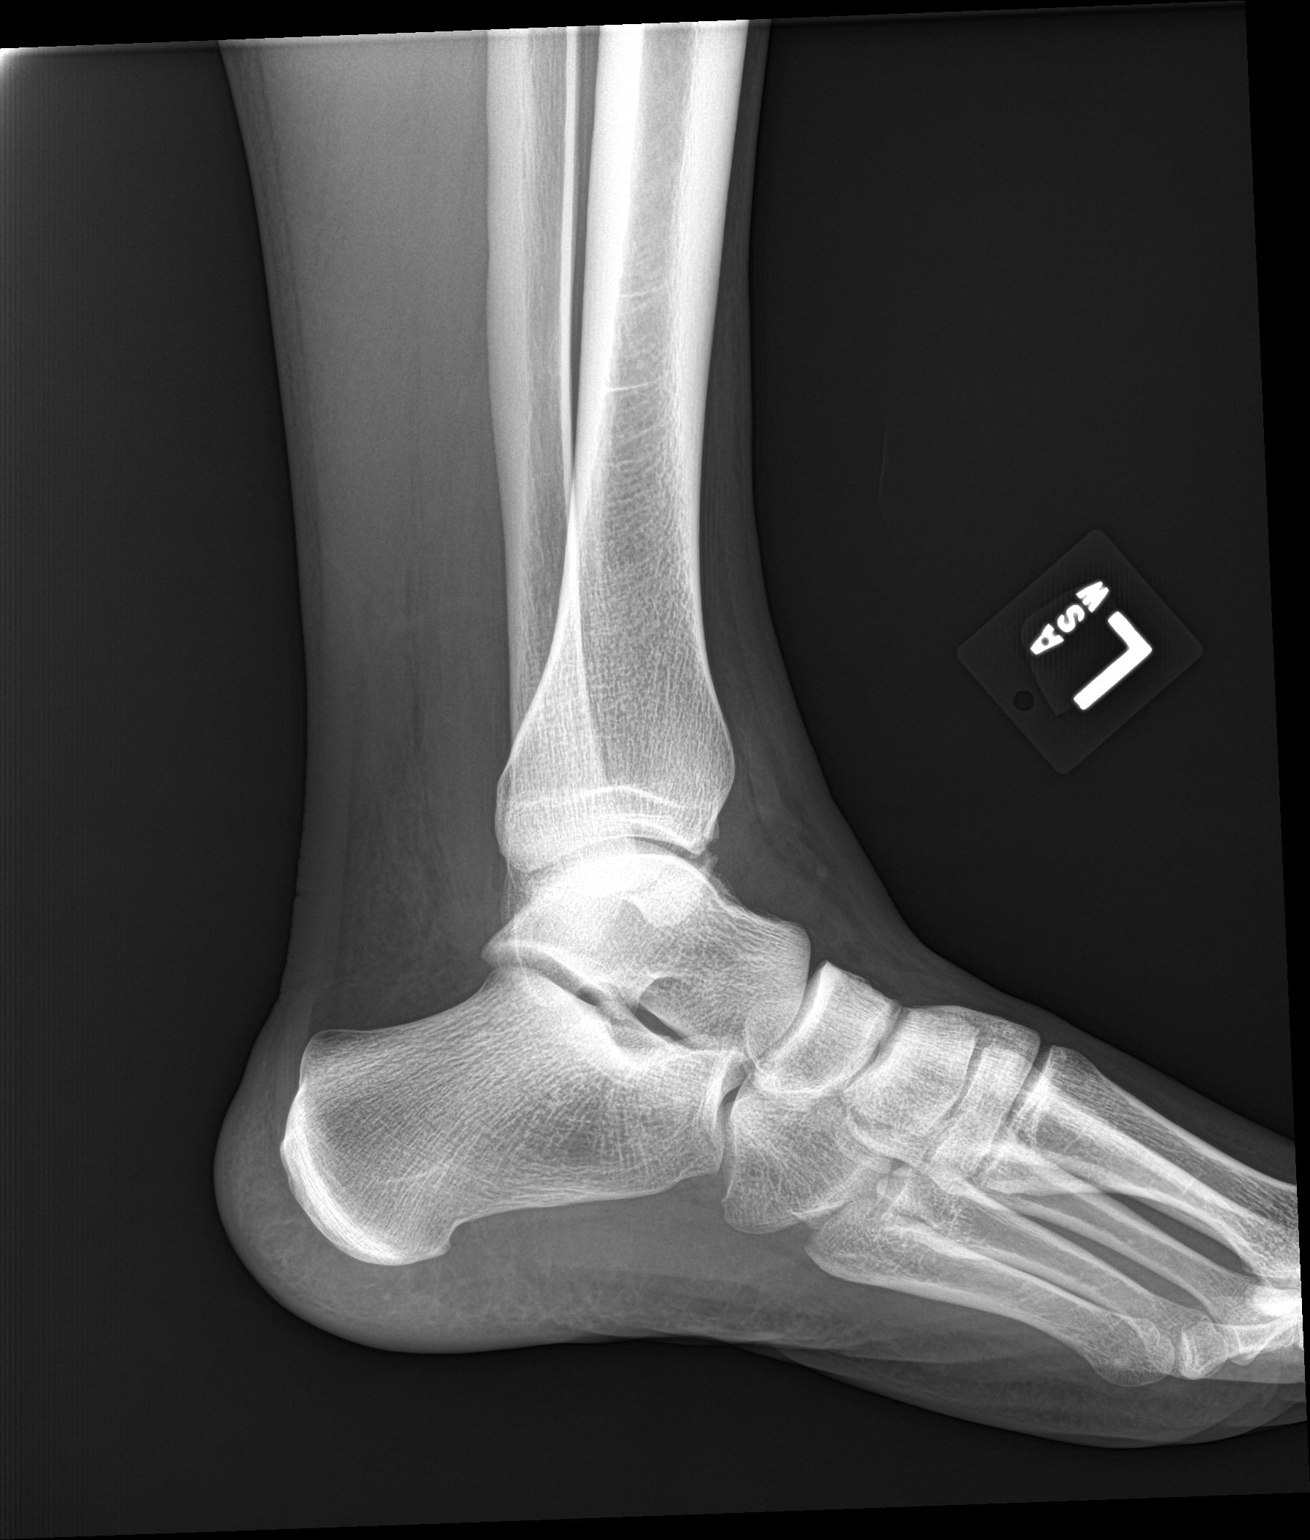

[3 of 3 positions shown; findings below may reference images not displayed]

FINDINGS: Three view radiograph left ankle demonstrates a transverse fracture
of the medial malleolus with mild displacement and near anatomic
alignment of the fracture fragment and moderate overlying soft
tissue swelling. The ankle mortise is intact. No other fracture or
dislocation identified.
IMPRESSION: Mildly displaced medial malleolar fracture with overlying soft
tissue swelling.

## 2021-11-24 ENCOUNTER — Other Ambulatory Visit: Payer: Self-pay

## 2021-11-24 ENCOUNTER — Ambulatory Visit
Admission: EM | Admit: 2021-11-24 | Discharge: 2021-11-24 | Disposition: A | Discharge status: Home / Self Care | Payer: BC Managed Care – PPO

## 2021-11-24 ENCOUNTER — Encounter: Payer: Self-pay | Admitting: Emergency Medicine

## 2021-11-24 DIAGNOSIS — J209 Acute bronchitis, unspecified: Secondary | ICD-10-CM | POA: Diagnosis not present

## 2021-11-24 DIAGNOSIS — R051 Acute cough: Secondary | ICD-10-CM

## 2021-11-24 DIAGNOSIS — J019 Acute sinusitis, unspecified: Secondary | ICD-10-CM

## 2021-11-24 DIAGNOSIS — J45909 Unspecified asthma, uncomplicated: Secondary | ICD-10-CM | POA: Diagnosis not present

## 2021-11-24 MED ORDER — PREDNISONE 20 MG PO TABS: 40.0000 mg | ORAL_TABLET | Freq: Every day | ORAL | 0 refills | Status: AC

## 2021-11-24 MED ORDER — AMOXICILLIN-POT CLAVULANATE 875-125 MG PO TABS: 1.0000 | ORAL_TABLET | Freq: Two times a day (BID) | ORAL | 0 refills | Status: AC

## 2021-11-24 NOTE — Discharge Instructions (Addendum)
-  Continue your at home medications and use your albuterol if you need it.  I have sent antibiotics for sinus infection/bronchitis.  Also prednisone to help open things up since your chest is very congested.  Add Mucinex. ?- If not feeling better after you finish antibiotics, you should return and we should get a chest x-ray to rule out pneumonia.  Return sooner if fever, worsening cough or increased breathing trouble.  Go to ER for any severe acute changes. ?

## 2021-11-24 NOTE — ED Triage Notes (Signed)
PT reports sinus infection symptoms that have now caused chest congestion. Reports nasal congestion started 7-10 days ago. History of asthma.  ?

## 2021-11-24 NOTE — ED Provider Notes (Signed)
?[facility] ? ? ? ?CSN: [id number] ?Arrival date & time: 11/24/21  9407 ? ? ?  ? ?History   ?Chief Complaint ?Chief Complaint  ?Patient presents with  ? Cough  ? Nasal Congestion  ? ? ?HPI ?Howard Decker is a 36 y.o. male presenting for greater than 10-day history of nasal congestion, sinus pressure and pain, clear nasal drainage.  Patient says over the past couple of days he started to have cough productive of yellowish-brown sputum.  Also reports chest tightness.  No breathing difficulty or wheezing.  He does have history of asthma.  Uses Symbicort every day.  Has not needed to use rescue inhaler.  Denies any fevers.  Patient says he has been taking over-the-counter decongestants.  Believes he may have a sinus infection.  Patient reports that he has had sinus infections before and seen the ENT specialist.  He says he is generally prescribed Augmentin and sometimes prednisone.  He says he has not had an antibiotic in a really long time.  He does report similar symptoms last month but says they got better on their own and then came back about 10 days ago.  No other complaints. ? ?HPI ? ?Past Medical History:  ?Diagnosis Date  ? Allergy   ? Thyroid disease   ? ? ?Patient Active Problem List  ? Diagnosis Date Noted  ? Asthma 08/21/2016  ? Hypothyroidism 08/21/2016  ? 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 ? ?History reviewed. No pertinent surgical history. ? ? ? ? ?Home Medications   ? ?Prior to Admission medications   ?Medication Sig Start Date End Date Taking? Authorizing Provider  ?amoxicillin-clavulanate (AUGMENTIN) 875-125 MG tablet Take 1 tablet by mouth every 12 (twelve) hours for 7 days. 11/24/21 12/01/21 Yes Eusebio Friendly B, PA-C  ?amphetamine-dextroamphetamine (ADDERALL) 30 MG tablet Take 1 tablet by mouth 2 (two) times daily. 03/22/20  Yes [provider]  ?fexofenadine (ALLEGRA) 60 MG tablet Take 60 mg by mouth 2 (two) times daily.   Yes [provider]  ?levothyroxine (SYNTHROID,  LEVOTHROID) 150 MCG tablet TAKE 1 TABLET(150 MCG) BY MOUTH DAILY 09/15/17  Yes Joni Reining, PA-C  ?predniSONE (DELTASONE) 20 MG tablet Take 2 tablets (40 mg total) by mouth daily for 5 days. 11/24/21 11/29/21 Yes Eusebio Friendly B, PA-C  ?SYMBICORT 80-4.5 MCG/ACT inhaler INHALE 2 PUFFS INTO THE LUNGS TWICE DAILY. 07/04/17  Yes Fisher, Roselyn Bering, PA-C  ?fluticasone (FLONASE) 50 MCG/ACT nasal spray SHAKE LIQUID AND USE 2 SPRAYS IN [facility] NOSTRIL EVERY DAY 07/04/17   Sherrie Mustache Roselyn Bering, PA-C  ?meloxicam (MOBIC) 15 MG tablet Take 1 tablet (15 mg total) by mouth daily as needed for pain. 05/25/20   Tommie Sams, DO  ? ? ?Family History ?Family History  ?Problem Relation Age of Onset  ? Hypothyroidism Mother   ? Diabetes Father   ? Heart disease Father   ? Hypertension Father   ? Alcohol abuse Brother   ? ? ?Social History ?Social History  ? ?Tobacco Use  ? Smoking status: Never  ? Smokeless tobacco: Current  ?  Types: Snuff  ?Vaping Use  ? Vaping Use: Never used  ?Substance Use Topics  ? Alcohol use: Yes  ? Drug use: No  ? ? ? ?Allergies   ?Omnicef [cefdinir] ? ? ?Review of Systems ?Review of Systems  ?Constitutional:  Positive for fatigue. Negative for fever.  ?HENT:  Positive for congestion, rhinorrhea and sinus pressure. Negative for sore throat.   ?Respiratory:  Positive for  cough and chest tightness. Negative for shortness of breath and wheezing.   ?Cardiovascular:  Negative for chest pain.  ?Gastrointestinal:  Negative for abdominal pain, diarrhea, nausea and vomiting.  ?Musculoskeletal:  Negative for myalgias.  ?Neurological:  Negative for weakness, light-headedness and headaches.  ?Hematological:  Negative for adenopathy.  ? ? ?Physical Exam ?Triage Vital Signs ?ED Triage Vitals  ?Enc Vitals Group  ?   BP 11/24/21 0833 127/86  ?   Pulse Rate 11/24/21 0833 60  ?   Resp 11/24/21 0833 16  ?   Temp 11/24/21 0833 98.3 ?F (36.8 ?C)  ?   Temp Source 11/24/21 0833 Oral  ?   SpO2 11/24/21 0833 98 %  ?   Weight --   ?   Height --    ?   Head Circumference --   ?   Peak Flow --   ?   Pain Score 11/24/21 0832 0  ?   Pain Loc --   ?   Pain Edu? --   ?   Excl. in GC? --   ? ?No data found. ? ?Updated Vital Signs ?BP 127/86   Pulse 60   Temp 98.3 ?F (36.8 ?C) (Oral)   Resp 16   SpO2 98%  ?   ? ?Physical Exam ?Vitals and nursing note reviewed.  ?Constitutional:   ?   General: He is not in acute distress. ?   Appearance: Normal appearance. He is well-developed. He is ill-appearing.  ?HENT:  ?   Head: Normocephalic and atraumatic.  ?   Right Ear: Tympanic membrane, ear canal and external ear normal.  ?   Left Ear: Tympanic membrane, ear canal and external ear normal.  ?   Nose: Congestion present.  ?   Mouth/Throat:  ?   Mouth: Mucous membranes are moist.  ?   Pharynx: Oropharynx is clear.  ?Eyes:  ?   General: No scleral icterus. ?   Conjunctiva/sclera: Conjunctivae normal.  ?Cardiovascular:  ?   Rate and Rhythm: Normal rate and regular rhythm.  ?   Heart sounds: Normal heart sounds.  ?Pulmonary:  ?   Effort: Pulmonary effort is normal. No respiratory distress.  ?   Breath sounds: Rhonchi (diffuse rhonchi throughout all lung fields) present.  ?Musculoskeletal:  ?   Cervical back: Neck supple.  ?Skin: ?   General: Skin is warm and dry.  ?   Capillary Refill: Capillary refill takes less than 2 seconds.  ?Neurological:  ?   General: No focal deficit present.  ?   Mental Status: He is alert. Mental status is at baseline.  ?   Motor: No weakness.  ?   Coordination: Coordination normal.  ?   Gait: Gait normal.  ?Psychiatric:     ?   Mood and Affect: Mood normal.     ?   Behavior: Behavior normal.     ?   Thought Content: Thought content normal.  ? ? ? ?UC Treatments / Results  ?Labs ?(all labs ordered are listed, but only abnormal results are displayed) ?Labs Reviewed - No data to display ? ?EKG ? ? ?Radiology ?No results found. ? ?Procedures ?Procedures (including critical care time) ? ?Medications Ordered in UC ?Medications - No data to  display ? ?Initial Impression / Assessment and Plan / UC Course  ?I have reviewed the triage vital signs and the nursing notes. ? ?Pertinent labs & imaging results that were available during my care of the patient were reviewed by me  and considered in my medical decision making (see chart for details). ? ?36 year old male presenting for greater than 10-day history of cough and congestion.  Patient reports over the past couple of days his sputum production has become yellowish-brown.  Patient reports similar symptoms last month that went away on their own and then came back 10 days ago.  History of sinusitis and bronchitis.  Also history of asthma and allergies.  Has tried OTC meds without relief.  Vitals normal and stable.  Patient is mildly ill-appearing but nontoxic.  On exam he does have nasal congestion and swelling of nasal mucosal edema, postnasal drainage.  Additionally he has diffuse rhonchi throughout all lung fields.  He is in no respiratory distress.  Spoke with patient about concern for secondary bacterial infection especially given his history.  Will prescribe Augmentin and prednisone.  Advised him to continue his inhaler and use albuterol if needed.  Also advised him to start Mucinex.  Supportive care.  Reviewed following back up if not improving to obtain chest x-ray.  We will hold off at this time. ? ?Final Clinical Impressions(s) / UC Diagnoses  ? ?Final diagnoses:  ?Acute sinusitis, recurrence not specified, unspecified location  ?Acute bronchitis, unspecified organism  ?Acute cough  ?Uncomplicated asthma, unspecified asthma severity, unspecified whether persistent  ? ? ? ?Discharge Instructions   ? ?  ?-Continue your at home medications and use your albuterol if you need it.  I have sent antibiotics for sinus infection/bronchitis.  Also prednisone to help open things up since your chest is very congested.  Add Mucinex. ?- If not feeling better after you finish antibiotics, you should return and we  should get a chest x-ray to rule out pneumonia.  Return sooner if fever, worsening cough or increased breathing trouble.  Go to ER for any severe acute changes. ? ? ? ? ?ED Prescriptions   ? ? Medication Sig Dispens

## 2023-06-08 ENCOUNTER — Encounter: Payer: Self-pay | Admitting: Emergency Medicine

## 2023-06-08 ENCOUNTER — Ambulatory Visit (INDEPENDENT_AMBULATORY_CARE_PROVIDER_SITE_OTHER): Payer: BC Managed Care – PPO

## 2023-06-08 ENCOUNTER — Ambulatory Visit
Admission: EM | Admit: 2023-06-08 | Discharge: 2023-06-08 | Disposition: A | Discharge status: Home / Self Care | Payer: BC Managed Care – PPO | Attending: Emergency Medicine | Admitting: Emergency Medicine

## 2023-06-08 DIAGNOSIS — S92312A Displaced fracture of first metatarsal bone, left foot, initial encounter for closed fracture: Secondary | ICD-10-CM | POA: Diagnosis not present

## 2023-06-08 DIAGNOSIS — M79675 Pain in left toe(s): Secondary | ICD-10-CM

## 2023-06-08 NOTE — ED Triage Notes (Signed)
Patient states that he slipped off the tractor and jammed his left big toe in the deck yesterday.  Patient c/o pain in his left big to and foot.

## 2023-06-08 NOTE — Discharge Instructions (Addendum)
You have a fracture of 1st metatarsal fracture. Rest,ice,elevate, wear boot cam walker-call Orthopedics for follow up Monday. May take tylenol/ibuprofen as label directed for pain.  [facility]: [address] [place], [place] [address] Phone: [phone] Urgent care hours 8a-7:30p Mon-Sat

## 2023-06-08 NOTE — ED Provider Notes (Signed)
[facility]    CSN: [id number] Arrival date & time: 06/08/23  1012      History   Chief Complaint Chief Complaint  Patient presents with   Foot Pain   Toe Pain    HPI Howard Decker is a 37 y.o. male.   37 year old male pt, Howard Decker, presents to urgent care for evaluation of left great toe/foot pain after slipping off tractor and jamming his big toe into deck. Pt c/o left big toe and foot pain.  + swelling and tenderness  The history is provided by the patient. No language interpreter was used.    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 No pertinent surgical history.     Home Medications    Prior to Admission medications   Medication Sig Start Date End Date Taking? Authorizing Provider  amphetamine-dextroamphetamine (ADDERALL) 30 MG tablet Take 1 tablet by mouth 2 (two) times daily. 03/22/20  Yes [provider]  levothyroxine (SYNTHROID, LEVOTHROID) 150 MCG tablet TAKE 1 TABLET(150 MCG) BY MOUTH DAILY 09/15/17  Yes Nona Dell K, PA-C  SYMBICORT 80-4.5 MCG/ACT inhaler INHALE 2 PUFFS INTO THE LUNGS TWICE DAILY. 07/04/17  Yes Fisher, Roselyn Bering, PA-C  fexofenadine (ALLEGRA) 60 MG tablet Take 60 mg by mouth 2 (two) times daily.    [provider]  fluticasone (FLONASE) 50 MCG/ACT nasal spray SHAKE LIQUID AND USE 2 SPRAYS IN EACH NOSTRIL EVERY DAY 07/04/17   Sherrie Mustache Roselyn Bering, PA-C  meloxicam (MOBIC) 15 MG tablet Take 1 tablet (15 mg total) by mouth daily as needed for pain. 05/25/20   Tommie Sams, DO    Family History Family History  Problem Relation Age of Onset   Hypothyroidism Mother    Diabetes Father    Heart disease Father     Hypertension Father    Alcohol abuse Brother     Social History Social History   Tobacco Use   Smoking status: Never   Smokeless tobacco: Current    Types: Snuff  Vaping Use   Vaping status: Never Used  Substance Use Topics   Alcohol use: Yes   Drug use: No     Allergies   Omnicef [cefdinir]   Review of Systems Review of Systems  Musculoskeletal:  Positive for gait problem and joint swelling.  Skin:  Positive for color change.  All other systems reviewed and are negative.    Physical Exam Triage Vital Signs ED Triage Vitals  Encounter Vitals Group     BP 06/08/23 1054 (!) 131/99     Systolic BP Percentile --      Diastolic BP Percentile --      Pulse Rate 06/08/23 1054 78     Resp 06/08/23 1054 15     Temp 06/08/23 1054 98.7 F (37.1 C)     Temp Source 06/08/23 1054 Oral     SpO2 06/08/23 1054 99 %     Weight 06/08/23 1052 200 lb (90.7 kg)     Height 06/08/23 1052 5\' 8"  (1.727 m)     Head Circumference --      Peak Flow --      Pain Score 06/08/23 1052  5     Pain Loc --      Pain Education --      Exclude from Growth Chart --    No data found.  Updated Vital Signs BP (!) 131/99 (BP Location: Left Arm)   Pulse 78   Temp 98.7 F (37.1 C) (Oral)   Resp 15   Ht 5\' 8"  (1.727 m)   Wt 200 lb (90.7 kg)   SpO2 99%   BMI 30.41 kg/m   Visual Acuity Right Eye Distance:   Left Eye Distance:   Bilateral Distance:    Right Eye Near:   Left Eye Near:    Bilateral Near:     Physical Exam Vitals and nursing note reviewed.  Constitutional:      General: He is not in acute distress.    Appearance: He is well-developed and well-groomed.  HENT:     Head: Normocephalic and atraumatic.  Eyes:     Conjunctiva/sclera: Conjunctivae normal.  Cardiovascular:     Rate and Rhythm: Normal rate and regular rhythm.     Pulses: Normal pulses.          Dorsalis pedis pulses are 2+ on the left side.     Heart sounds: Normal heart sounds. No murmur  heard. Pulmonary:     Effort: Pulmonary effort is normal. No respiratory distress.  Abdominal:     Palpations: Abdomen is soft.     Tenderness: There is no abdominal tenderness.  Musculoskeletal:        General: No swelling.     Cervical back: Neck supple.       Feet:  Feet:     Comments: Swelling and ecchymosis noted Skin:    General: Skin is warm and dry.     Capillary Refill: Capillary refill takes less than 2 seconds.     Comments: Skin intact  Neurological:     General: No focal deficit present.     Mental Status: He is alert and oriented to person, place, and time.     GCS: GCS eye subscore is 4. GCS verbal subscore is 5. GCS motor subscore is 6.     Cranial Nerves: No cranial nerve deficit.     Sensory: No sensory deficit.  Psychiatric:        Attention and Perception: Attention normal.        Mood and Affect: Mood normal.        Speech: Speech normal.        Behavior: Behavior normal. Behavior is cooperative.      UC Treatments / Results  Labs (all labs ordered are listed, but only abnormal results are displayed) Labs Reviewed - No data to display  EKG   Radiology DG Foot Complete Left  Result Date: 06/08/2023 CLINICAL DATA:  Left great toe pain following injury yesterday. EXAM: LEFT FOOT - COMPLETE 3+ VIEW COMPARISON:  Limited correlation made with ankle radiographs 05/25/2020. FINDINGS: Small acute intra-articular fracture involving the medial head of the 1st metatarsal, best seen on the oblique view this fracture is mildly displaced. No other evidence of acute fracture or dislocation. Possible mild flattening of the 2nd metatarsal head as can be seen with [place] infraction. Mild posttraumatic deformity of the medial malleolus. The bones otherwise appear unremarkable. The joint spaces are preserved. No evidence of foreign body or soft tissue emphysema. IMPRESSION: Mildly displaced intra-articular fracture involving the head of the 1st metatarsal as described.  Electronically Signed   By: Hilarie Fredrickson.D.  On: 06/08/2023 11:47    Procedures Procedures (including critical care time)  Medications Ordered in UC Medications - No data to display  Initial Impression / Assessment and Plan / UC Course  I have reviewed the triage vital signs and the nursing notes.  Pertinent labs & imaging results that were available during my care of the patient were reviewed by me and considered in my medical decision making (see chart for details).  Clinical Course as of 06/08/23 1302  Sun Jun 08, 2023  1106 Left foot xray ordered. [JD]  1150 +fracture, 1st metatarsal, will treat with cam walker boot [JD]  1200 Cam boot walker given [JD]    Clinical Course User Index [JD] Brayn Eckstein, Para March, NP    Ddx: Left foot pain, left toe pain, contusion Final Clinical Impressions(s) / UC Diagnoses   Final diagnoses:  Displaced fracture of first metatarsal bone, left foot, initial encounter for closed fracture     Discharge Instructions      You have a fracture of 1st metatarsal fracture. Rest,ice,elevate, wear boot cam walker-call Orthopedics for follow up Monday. May take tylenol/ibuprofen as label directed for pain.  [facility]: [address] [place], [place] [address] Phone: [phone] Urgent care hours 8a-7:30p Mon-Sat     ED Prescriptions   None    PDMP not reviewed this encounter.   Clancy Gourd, NP 06/08/23 1303

## 2024-07-18 ENCOUNTER — Encounter: Payer: Self-pay | Admitting: Emergency Medicine

## 2024-07-18 ENCOUNTER — Ambulatory Visit
Admission: EM | Admit: 2024-07-18 | Discharge: 2024-07-18 | Disposition: A | Discharge status: Home / Self Care | Attending: Physician Assistant | Admitting: Physician Assistant

## 2024-07-18 DIAGNOSIS — J45901 Unspecified asthma with (acute) exacerbation: Secondary | ICD-10-CM

## 2024-07-18 DIAGNOSIS — J069 Acute upper respiratory infection, unspecified: Secondary | ICD-10-CM | POA: Diagnosis not present

## 2024-07-18 DIAGNOSIS — R051 Acute cough: Secondary | ICD-10-CM | POA: Diagnosis not present

## 2024-07-18 DIAGNOSIS — R0981 Nasal congestion: Secondary | ICD-10-CM | POA: Diagnosis not present

## 2024-07-18 LAB — POC COVID19/FLU A&B COMBO
Covid Antigen, POC: NEGATIVE
Influenza A Antigen, POC: NEGATIVE
Influenza B Antigen, POC: NEGATIVE

## 2024-07-18 MED ORDER — PREDNISONE 20 MG PO TABS: 40.0000 mg | ORAL_TABLET | Freq: Every day | ORAL | 0 refills | Status: AC

## 2024-07-18 MED ORDER — PROMETHAZINE-DM 6.25-15 MG/5ML PO SYRP: 5.0000 mL | ORAL_SOLUTION | Freq: Four times a day (QID) | ORAL | 0 refills | Status: AC | PRN

## 2024-07-18 NOTE — Discharge Instructions (Signed)
-  Negative COVID and flu  URI/COLD SYMPTOMS: Your exam today is consistent with a viral illness. Antibiotics are not indicated at this time as you have only been ill for 2 days and presentation does not suggest bacterial infection. Use medications as directed, including cough syrup, nasal saline, and decongestants. Your symptoms should improve over the next few days and resolve within  a couple of weeks. Increase rest and fluids. Continue to use inhalers. Start prednisone . F/u if symptoms worsen or predominate such as sore throat, ear pain, productive cough, shortness of breath, or if you develop high fevers or worsening fatigue over the next several days.

## 2024-07-18 NOTE — ED Triage Notes (Signed)
 Pt states I have a sinus infection. Pt c/o nasal congestion, cough. Started about 3 days ago. Denies fever. He states he is blowing out green/yellow mucus.

## 2024-07-18 NOTE — ED Provider Notes (Signed)
 [facility]    CSN: [id number] Arrival date & time: 07/18/24  9057      History   Chief Complaint Chief Complaint  Patient presents with   Nasal Congestion   Cough    HPI Howard Decker is a 38 y.o. male presenting for 2-day history of productive cough, nasal congestion, scratchy throat, sinus pressure, and fatigue.  Patient says it feels like the cough is starting to move to his chest and he reports a bit of increased shortness of breath from baseline. He does have history of asthma.  Uses Symbicort  as needed. Denies any fevers.  Patient says he has been taking over-the-counter decongestants.  Believes he may have a sinus infection and requests Augmentin . No recent illnesses reported in the past month.  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 No pertinent surgical history.     Home Medications    Prior to Admission medications   Medication Sig Start Date End Date Taking? Authorizing Provider  amphetamine-dextroamphetamine (ADDERALL) 30 MG tablet Take 1 tablet by mouth 2 (two) times daily. 03/22/20  Yes [provider]  fluticasone  (FLONASE ) 50 MCG/ACT nasal spray SHAKE LIQUID AND USE 2 SPRAYS IN EACH NOSTRIL EVERY DAY 07/04/17  Yes Fisher, Devere ORN, PA-C  levothyroxine  (SYNTHROID , LEVOTHROID) 150 MCG tablet TAKE 1 TABLET(150 MCG) BY MOUTH DAILY 09/15/17  Yes Claudene Tanda POUR, PA-C  predniSONE  (DELTASONE ) 20 MG tablet Take 2 tablets (40 mg total) by mouth daily for 5 days. 07/18/24 07/23/24 Yes Arvis Jolan NOVAK, PA-C  promethazine-dextromethorphan (PROMETHAZINE-DM) 6.25-15 MG/5ML syrup Take 5 mLs by mouth 4 (four) times daily as needed. 07/18/24  Yes Arvis Jolan B, PA-C  SYMBICORT  80-4.5 MCG/ACT inhaler INHALE 2 PUFFS INTO THE LUNGS TWICE DAILY. 07/04/17  Yes Fisher, Devere ORN, PA-C  fexofenadine  (ALLEGRA ) 60 MG tablet Take 60 mg by mouth 2 (two) times daily.    [provider]  meloxicam  (MOBIC ) 15 MG tablet Take 1 tablet (15 mg total) by mouth daily as needed for pain. 05/25/20   Cook, Jayce G, DO    Family History Family History  Problem Relation Age of Onset   Hypothyroidism Mother    Diabetes Father    Heart disease Father    Hypertension Father    Alcohol abuse Brother     Social History Social History   Tobacco Use   Smoking status: Never   Smokeless tobacco: Current    Types: Snuff  Vaping Use   Vaping status: Never Used  Substance Use Topics   Alcohol use: Yes   Drug use: No     Allergies   Omnicef  [cefdinir ]   Review of Systems Review of Systems  Constitutional:  Positive for fatigue. Negative for fever.  HENT:  Positive for congestion, rhinorrhea, sinus pressure and sore throat.   Respiratory:  Positive for cough and shortness of breath. Negative for wheezing.   Cardiovascular:  Negative for chest pain.  Gastrointestinal:  Negative for abdominal pain, diarrhea, nausea and vomiting.  Musculoskeletal:  Negative for myalgias.  Neurological:  Negative for weakness, light-headedness and headaches.  Hematological:  Negative for adenopathy.     Physical Exam Triage Vital Signs  ED Triage Vitals  Enc Vitals Group     BP 11/24/21 0833 127/86     Pulse Rate 11/24/21 0833 60     Resp 11/24/21 0833 16     Temp 11/24/21 0833 98.3 F (36.8 C)     Temp Source 11/24/21 0833 Oral     SpO2 11/24/21 0833 98 %     Weight --      Height --      Head Circumference --      Peak Flow --      Pain Score 11/24/21 0832 0     Pain Loc --      Pain Edu? --      Excl. in GC? --    No data found.  Updated Vital Signs BP (!) 129/90 (BP Location: Right Arm)   Pulse 71   Temp 98.7 F (37.1 C) (Oral)   Resp 16    Ht 5' 8 (1.727 m)   Wt 199 lb 15.3 oz (90.7 kg)   SpO2 94%   BMI 30.40 kg/m      Physical Exam Vitals and nursing note reviewed.  Constitutional:      General: He is not in acute distress.    Appearance: Normal appearance. He is well-developed. He is ill-appearing.  HENT:     Head: Normocephalic and atraumatic.     Right Ear: Tympanic membrane, ear canal and external ear normal.     Left Ear: Tympanic membrane, ear canal and external ear normal.     Nose: Congestion present.     Mouth/Throat:     Mouth: Mucous membranes are moist.     Pharynx: Oropharynx is clear.  Eyes:     General: No scleral icterus.    Conjunctiva/sclera: Conjunctivae normal.  Cardiovascular:     Rate and Rhythm: Normal rate and regular rhythm.     Heart sounds: Normal heart sounds.  Pulmonary:     Effort: Pulmonary effort is normal. No respiratory distress.     Breath sounds: Rhonchi (diffuse rhonchi throughout all lung fields) present.  Musculoskeletal:     Cervical back: Neck supple.  Skin:    General: Skin is warm and dry.     Capillary Refill: Capillary refill takes less than 2 seconds.  Neurological:     General: No focal deficit present.     Mental Status: He is alert. Mental status is at baseline.     Motor: No weakness.     Coordination: Coordination normal.     Gait: Gait normal.  Psychiatric:        Mood and Affect: Mood normal.        Behavior: Behavior normal.      UC Treatments / Results  Labs (all labs ordered are listed, but only abnormal results are displayed) Labs Reviewed  POC COVID19/FLU A&B COMBO - Normal    EKG   Radiology No results found.  Procedures Procedures (including critical care time)  Medications Ordered in UC Medications - No data to display  Initial Impression / Assessment and Plan / UC Course  I have reviewed the triage vital signs and the nursing notes.  Pertinent labs & imaging results that were available during my care of the patient  were reviewed by me and considered in my medical decision making (see chart for details).  38 year old male presenting for greater than 2-day history of cough and congestion. No fevers. Mildly increased SOB from baseline.  History of asthma and allergies.  Has tried  Symbicort , Flonase  and mucinex without relief.    Vitals normal and stable.  Patient is mildly ill-appearing but nontoxic.  On exam he does have nasal congestion and swelling of nasal mucosal edema, postnasal drainage.  Clear nasal drainage. Additionally he has few scattered rhonchi of upper lung fields.  He is in no respiratory distress.    Negative flu/COVID testing. Declines strep testing.  Advised patient symptoms are most consistent with a viral illness and could be moving to bronchitis which can last a couple weeks.  Sent prednisone  to pharmacy for asthma flare as well as Promethazine DM.  Advised him to continue his inhaler and use albuterol if needed.  Advised if no improvement after the next 10 days or potential acute worsening of symptoms which may suggest bacterial infection and to return for reevaluation and consideration of antibiotics but not indicated based on today's exam.  If shortness of breath not improving with prednisone  and inhalers, advised following back up if not improving to obtain chest x-ray.     Final Clinical Impressions(s) / UC Diagnoses   Final diagnoses:  Nasal congestion  Viral upper respiratory tract infection  Exacerbation of asthma, unspecified asthma severity, unspecified whether persistent  Acute cough     Discharge Instructions      -Negative COVID and flu  URI/COLD SYMPTOMS: Your exam today is consistent with a viral illness. Antibiotics are not indicated at this time as you have only been ill for 2 days and presentation does not suggest bacterial infection. Use medications as directed, including cough syrup, nasal saline, and decongestants. Your symptoms should improve over the next few  days and resolve within  a couple of weeks. Increase rest and fluids. Continue to use inhalers. Start prednisone . F/u if symptoms worsen or predominate such as sore throat, ear pain, productive cough, shortness of breath, or if you develop high fevers or worsening fatigue over the next several days.       ED Prescriptions     Medication Sig Dispense Auth. Provider   predniSONE  (DELTASONE ) 20 MG tablet Take 2 tablets (40 mg total) by mouth daily for 5 days. 10 tablet Arvis Jolan NOVAK, PA-C   promethazine-dextromethorphan (PROMETHAZINE-DM) 6.25-15 MG/5ML syrup Take 5 mLs by mouth 4 (four) times daily as needed. 118 mL Arvis Jolan NOVAK, PA-C      PDMP not reviewed this encounter.    Arvis Jolan NOVAK, PA-C 07/18/24 1048
